# Patient Record
Sex: Female | Born: 1985
Health system: Southern US, Community
[De-identification: ages and names within clinical notes are randomized; demographics above are authoritative.]

## PROBLEM LIST (undated history)

## (undated) DIAGNOSIS — B019 Varicella without complication: Secondary | ICD-10-CM

## (undated) DIAGNOSIS — J939 Pneumothorax, unspecified: Secondary | ICD-10-CM

## (undated) DIAGNOSIS — C8330 Diffuse large B-cell lymphoma, unspecified site: Secondary | ICD-10-CM

## (undated) DIAGNOSIS — J45909 Unspecified asthma, uncomplicated: Secondary | ICD-10-CM

## (undated) DIAGNOSIS — D6481 Anemia due to antineoplastic chemotherapy: Secondary | ICD-10-CM

## (undated) DIAGNOSIS — T451X5A Adverse effect of antineoplastic and immunosuppressive drugs, initial encounter: Secondary | ICD-10-CM

## (undated) DIAGNOSIS — Z9289 Personal history of other medical treatment: Secondary | ICD-10-CM

## (undated) HISTORY — DX: Anemia due to antineoplastic chemotherapy: D64.81

## (undated) HISTORY — DX: Varicella without complication: B01.9

## (undated) HISTORY — DX: Diffuse large B-cell lymphoma, unspecified site: C83.30

## (undated) HISTORY — DX: Pneumothorax, unspecified: J93.9

## (undated) HISTORY — DX: Adverse effect of antineoplastic and immunosuppressive drugs, initial encounter: T45.1X5A

## (undated) HISTORY — DX: Personal history of other medical treatment: Z92.89

---

## 2008-07-02 ENCOUNTER — Emergency Department (HOSPITAL_BASED_OUTPATIENT_CLINIC_OR_DEPARTMENT_OTHER): Admission: EM | Admit: 2008-07-02 | Discharge: 2008-07-02 | Payer: Self-pay | Admitting: Emergency Medicine

## 2008-07-12 ENCOUNTER — Other Ambulatory Visit: Admission: RE | Admit: 2008-07-12 | Discharge: 2008-07-12 | Payer: Self-pay | Admitting: Family Medicine

## 2013-01-04 ENCOUNTER — Other Ambulatory Visit: Payer: Self-pay | Admitting: Family Medicine

## 2013-01-04 ENCOUNTER — Other Ambulatory Visit (HOSPITAL_COMMUNITY)
Admission: RE | Admit: 2013-01-04 | Discharge: 2013-01-04 | Disposition: A | Discharge status: Home / Self Care | Payer: BC Managed Care – PPO | Source: Ambulatory Visit | Attending: Family Medicine | Admitting: Family Medicine

## 2013-01-04 DIAGNOSIS — Z124 Encounter for screening for malignant neoplasm of cervix: Secondary | ICD-10-CM | POA: Insufficient documentation

## 2013-06-22 ENCOUNTER — Other Ambulatory Visit: Payer: Self-pay | Admitting: Family Medicine

## 2013-06-22 DIAGNOSIS — E041 Nontoxic single thyroid nodule: Secondary | ICD-10-CM

## 2013-07-05 ENCOUNTER — Ambulatory Visit
Admission: RE | Admit: 2013-07-05 | Discharge: 2013-07-05 | Disposition: A | Discharge status: Home / Self Care | Payer: BC Managed Care – PPO | Source: Ambulatory Visit | Attending: Family Medicine | Admitting: Family Medicine

## 2013-07-05 DIAGNOSIS — E041 Nontoxic single thyroid nodule: Secondary | ICD-10-CM

## 2013-07-06 ENCOUNTER — Other Ambulatory Visit: Payer: Self-pay | Admitting: Family Medicine

## 2013-07-06 DIAGNOSIS — E041 Nontoxic single thyroid nodule: Secondary | ICD-10-CM

## 2013-07-22 ENCOUNTER — Other Ambulatory Visit (HOSPITAL_COMMUNITY)
Admission: RE | Admit: 2013-07-22 | Discharge: 2013-07-22 | Disposition: A | Discharge status: Home / Self Care | Payer: BC Managed Care – PPO | Source: Ambulatory Visit | Attending: Interventional Radiology | Admitting: Interventional Radiology

## 2013-07-22 ENCOUNTER — Ambulatory Visit
Admission: RE | Admit: 2013-07-22 | Discharge: 2013-07-22 | Disposition: A | Discharge status: Home / Self Care | Payer: BC Managed Care – PPO | Source: Ambulatory Visit | Attending: Family Medicine | Admitting: Family Medicine

## 2013-07-22 DIAGNOSIS — E041 Nontoxic single thyroid nodule: Secondary | ICD-10-CM | POA: Insufficient documentation

## 2013-12-08 ENCOUNTER — Other Ambulatory Visit: Payer: Self-pay | Admitting: Family Medicine

## 2013-12-08 DIAGNOSIS — E041 Nontoxic single thyroid nodule: Secondary | ICD-10-CM

## 2013-12-24 ENCOUNTER — Ambulatory Visit
Admission: RE | Admit: 2013-12-24 | Discharge: 2013-12-24 | Disposition: A | Discharge status: Home / Self Care | Payer: BC Managed Care – PPO | Source: Ambulatory Visit | Attending: Family Medicine | Admitting: Family Medicine

## 2013-12-24 DIAGNOSIS — E041 Nontoxic single thyroid nodule: Secondary | ICD-10-CM

## 2014-12-26 ENCOUNTER — Other Ambulatory Visit: Payer: Self-pay | Admitting: Family Medicine

## 2014-12-26 DIAGNOSIS — E041 Nontoxic single thyroid nodule: Secondary | ICD-10-CM

## 2014-12-29 ENCOUNTER — Ambulatory Visit
Admission: RE | Admit: 2014-12-29 | Discharge: 2014-12-29 | Disposition: A | Discharge status: Home / Self Care | Payer: BLUE CROSS/BLUE SHIELD | Source: Ambulatory Visit | Attending: Family Medicine | Admitting: Family Medicine

## 2014-12-29 DIAGNOSIS — E041 Nontoxic single thyroid nodule: Secondary | ICD-10-CM

## 2016-01-23 ENCOUNTER — Other Ambulatory Visit: Payer: Self-pay | Admitting: Family Medicine

## 2016-01-23 DIAGNOSIS — E042 Nontoxic multinodular goiter: Secondary | ICD-10-CM

## 2016-02-20 DIAGNOSIS — E041 Nontoxic single thyroid nodule: Secondary | ICD-10-CM | POA: Diagnosis not present

## 2016-03-20 DIAGNOSIS — E041 Nontoxic single thyroid nodule: Secondary | ICD-10-CM | POA: Diagnosis not present

## 2016-03-20 DIAGNOSIS — Z0000 Encounter for general adult medical examination without abnormal findings: Secondary | ICD-10-CM | POA: Diagnosis not present

## 2016-04-05 DIAGNOSIS — E042 Nontoxic multinodular goiter: Secondary | ICD-10-CM | POA: Diagnosis not present

## 2016-04-05 DIAGNOSIS — Z8639 Personal history of other endocrine, nutritional and metabolic disease: Secondary | ICD-10-CM | POA: Diagnosis not present

## 2016-04-16 DIAGNOSIS — F4323 Adjustment disorder with mixed anxiety and depressed mood: Secondary | ICD-10-CM | POA: Diagnosis not present

## 2016-04-25 DIAGNOSIS — F4323 Adjustment disorder with mixed anxiety and depressed mood: Secondary | ICD-10-CM | POA: Diagnosis not present

## 2016-05-08 DIAGNOSIS — F4323 Adjustment disorder with mixed anxiety and depressed mood: Secondary | ICD-10-CM | POA: Diagnosis not present

## 2016-05-30 DIAGNOSIS — F4323 Adjustment disorder with mixed anxiety and depressed mood: Secondary | ICD-10-CM | POA: Diagnosis not present

## 2016-06-26 DIAGNOSIS — F4323 Adjustment disorder with mixed anxiety and depressed mood: Secondary | ICD-10-CM | POA: Diagnosis not present

## 2016-07-24 DIAGNOSIS — F4323 Adjustment disorder with mixed anxiety and depressed mood: Secondary | ICD-10-CM | POA: Diagnosis not present

## 2016-07-24 DIAGNOSIS — L309 Dermatitis, unspecified: Secondary | ICD-10-CM | POA: Diagnosis not present

## 2016-08-09 DIAGNOSIS — J4541 Moderate persistent asthma with (acute) exacerbation: Secondary | ICD-10-CM | POA: Diagnosis not present

## 2016-09-04 DIAGNOSIS — F4323 Adjustment disorder with mixed anxiety and depressed mood: Secondary | ICD-10-CM | POA: Diagnosis not present

## 2016-09-09 DIAGNOSIS — J4541 Moderate persistent asthma with (acute) exacerbation: Secondary | ICD-10-CM | POA: Diagnosis not present

## 2016-09-11 DIAGNOSIS — J90 Pleural effusion, not elsewhere classified: Secondary | ICD-10-CM | POA: Diagnosis not present

## 2016-09-11 DIAGNOSIS — R05 Cough: Secondary | ICD-10-CM | POA: Diagnosis not present

## 2016-09-12 DIAGNOSIS — R05 Cough: Secondary | ICD-10-CM | POA: Diagnosis not present

## 2016-09-13 DIAGNOSIS — R222 Localized swelling, mass and lump, trunk: Secondary | ICD-10-CM | POA: Diagnosis not present

## 2016-09-13 DIAGNOSIS — R938 Abnormal findings on diagnostic imaging of other specified body structures: Secondary | ICD-10-CM | POA: Diagnosis not present

## 2016-09-13 DIAGNOSIS — J9859 Other diseases of mediastinum, not elsewhere classified: Secondary | ICD-10-CM | POA: Diagnosis not present

## 2016-09-13 DIAGNOSIS — J45909 Unspecified asthma, uncomplicated: Secondary | ICD-10-CM | POA: Diagnosis not present

## 2016-09-13 DIAGNOSIS — R599 Enlarged lymph nodes, unspecified: Secondary | ICD-10-CM | POA: Diagnosis not present

## 2016-09-13 DIAGNOSIS — J90 Pleural effusion, not elsewhere classified: Secondary | ICD-10-CM | POA: Diagnosis not present

## 2016-09-13 DIAGNOSIS — R590 Localized enlarged lymph nodes: Secondary | ICD-10-CM | POA: Diagnosis not present

## 2016-09-13 DIAGNOSIS — R161 Splenomegaly, not elsewhere classified: Secondary | ICD-10-CM | POA: Diagnosis not present

## 2016-09-13 DIAGNOSIS — R0602 Shortness of breath: Secondary | ICD-10-CM | POA: Diagnosis not present

## 2016-09-13 DIAGNOSIS — R591 Generalized enlarged lymph nodes: Secondary | ICD-10-CM | POA: Diagnosis not present

## 2016-09-13 DIAGNOSIS — R918 Other nonspecific abnormal finding of lung field: Secondary | ICD-10-CM | POA: Diagnosis not present

## 2016-09-13 DIAGNOSIS — C8510 Unspecified B-cell lymphoma, unspecified site: Secondary | ICD-10-CM | POA: Diagnosis not present

## 2016-09-13 DIAGNOSIS — J9811 Atelectasis: Secondary | ICD-10-CM | POA: Diagnosis not present

## 2016-09-13 DIAGNOSIS — E041 Nontoxic single thyroid nodule: Secondary | ICD-10-CM | POA: Diagnosis not present

## 2016-09-14 DIAGNOSIS — R918 Other nonspecific abnormal finding of lung field: Secondary | ICD-10-CM | POA: Diagnosis not present

## 2016-09-14 DIAGNOSIS — R222 Localized swelling, mass and lump, trunk: Secondary | ICD-10-CM | POA: Diagnosis not present

## 2016-09-14 DIAGNOSIS — J9859 Other diseases of mediastinum, not elsewhere classified: Secondary | ICD-10-CM | POA: Diagnosis not present

## 2016-09-14 DIAGNOSIS — J90 Pleural effusion, not elsewhere classified: Secondary | ICD-10-CM | POA: Diagnosis not present

## 2016-09-14 DIAGNOSIS — R0602 Shortness of breath: Secondary | ICD-10-CM | POA: Diagnosis not present

## 2016-09-14 DIAGNOSIS — C8510 Unspecified B-cell lymphoma, unspecified site: Secondary | ICD-10-CM | POA: Diagnosis not present

## 2016-09-14 DIAGNOSIS — R591 Generalized enlarged lymph nodes: Secondary | ICD-10-CM | POA: Diagnosis not present

## 2016-09-19 DIAGNOSIS — C8339 Diffuse large B-cell lymphoma, extranodal and solid organ sites: Secondary | ICD-10-CM | POA: Diagnosis not present

## 2016-09-19 DIAGNOSIS — C8332 Diffuse large B-cell lymphoma, intrathoracic lymph nodes: Secondary | ICD-10-CM | POA: Diagnosis not present

## 2016-09-19 DIAGNOSIS — R918 Other nonspecific abnormal finding of lung field: Secondary | ICD-10-CM | POA: Diagnosis not present

## 2016-09-20 DIAGNOSIS — J9859 Other diseases of mediastinum, not elsewhere classified: Secondary | ICD-10-CM | POA: Diagnosis not present

## 2016-09-20 DIAGNOSIS — R05 Cough: Secondary | ICD-10-CM | POA: Diagnosis not present

## 2016-09-20 DIAGNOSIS — E041 Nontoxic single thyroid nodule: Secondary | ICD-10-CM | POA: Diagnosis not present

## 2016-09-24 DIAGNOSIS — N946 Dysmenorrhea, unspecified: Secondary | ICD-10-CM | POA: Diagnosis not present

## 2016-09-24 DIAGNOSIS — J910 Malignant pleural effusion: Secondary | ICD-10-CM | POA: Diagnosis not present

## 2016-09-24 DIAGNOSIS — Z452 Encounter for adjustment and management of vascular access device: Secondary | ICD-10-CM | POA: Diagnosis not present

## 2016-09-24 DIAGNOSIS — R918 Other nonspecific abnormal finding of lung field: Secondary | ICD-10-CM | POA: Diagnosis not present

## 2016-09-24 DIAGNOSIS — R0609 Other forms of dyspnea: Secondary | ICD-10-CM | POA: Diagnosis not present

## 2016-09-24 DIAGNOSIS — J90 Pleural effusion, not elsewhere classified: Secondary | ICD-10-CM | POA: Diagnosis not present

## 2016-09-24 DIAGNOSIS — R222 Localized swelling, mass and lump, trunk: Secondary | ICD-10-CM | POA: Diagnosis not present

## 2016-09-24 DIAGNOSIS — E874 Mixed disorder of acid-base balance: Secondary | ICD-10-CM | POA: Diagnosis not present

## 2016-09-24 DIAGNOSIS — E041 Nontoxic single thyroid nodule: Secondary | ICD-10-CM | POA: Diagnosis not present

## 2016-09-24 DIAGNOSIS — K589 Irritable bowel syndrome without diarrhea: Secondary | ICD-10-CM | POA: Diagnosis not present

## 2016-09-24 DIAGNOSIS — J45909 Unspecified asthma, uncomplicated: Secondary | ICD-10-CM | POA: Diagnosis not present

## 2016-09-24 DIAGNOSIS — C8332 Diffuse large B-cell lymphoma, intrathoracic lymph nodes: Secondary | ICD-10-CM | POA: Diagnosis not present

## 2016-09-24 DIAGNOSIS — J9859 Other diseases of mediastinum, not elsewhere classified: Secondary | ICD-10-CM | POA: Diagnosis not present

## 2016-09-24 DIAGNOSIS — C8330 Diffuse large B-cell lymphoma, unspecified site: Secondary | ICD-10-CM | POA: Diagnosis not present

## 2016-09-24 DIAGNOSIS — C801 Malignant (primary) neoplasm, unspecified: Secondary | ICD-10-CM | POA: Diagnosis not present

## 2016-09-24 DIAGNOSIS — R079 Chest pain, unspecified: Secondary | ICD-10-CM | POA: Diagnosis not present

## 2016-09-24 DIAGNOSIS — R05 Cough: Secondary | ICD-10-CM | POA: Diagnosis not present

## 2016-09-24 DIAGNOSIS — R55 Syncope and collapse: Secondary | ICD-10-CM | POA: Diagnosis not present

## 2016-09-24 DIAGNOSIS — I959 Hypotension, unspecified: Secondary | ICD-10-CM | POA: Diagnosis not present

## 2016-09-25 DIAGNOSIS — C8332 Diffuse large B-cell lymphoma, intrathoracic lymph nodes: Secondary | ICD-10-CM | POA: Diagnosis not present

## 2016-09-25 DIAGNOSIS — J90 Pleural effusion, not elsewhere classified: Secondary | ICD-10-CM | POA: Diagnosis not present

## 2016-09-25 DIAGNOSIS — J9859 Other diseases of mediastinum, not elsewhere classified: Secondary | ICD-10-CM | POA: Diagnosis not present

## 2016-09-25 DIAGNOSIS — C8330 Diffuse large B-cell lymphoma, unspecified site: Secondary | ICD-10-CM | POA: Diagnosis not present

## 2016-09-25 DIAGNOSIS — R079 Chest pain, unspecified: Secondary | ICD-10-CM | POA: Diagnosis not present

## 2016-09-26 DIAGNOSIS — C8332 Diffuse large B-cell lymphoma, intrathoracic lymph nodes: Secondary | ICD-10-CM | POA: Diagnosis not present

## 2016-09-27 DIAGNOSIS — R079 Chest pain, unspecified: Secondary | ICD-10-CM | POA: Diagnosis not present

## 2016-09-28 DIAGNOSIS — R079 Chest pain, unspecified: Secondary | ICD-10-CM | POA: Diagnosis not present

## 2016-09-29 DIAGNOSIS — R079 Chest pain, unspecified: Secondary | ICD-10-CM | POA: Diagnosis not present

## 2016-09-30 DIAGNOSIS — R0609 Other forms of dyspnea: Secondary | ICD-10-CM | POA: Diagnosis not present

## 2016-09-30 DIAGNOSIS — J910 Malignant pleural effusion: Secondary | ICD-10-CM | POA: Diagnosis not present

## 2016-09-30 DIAGNOSIS — R079 Chest pain, unspecified: Secondary | ICD-10-CM | POA: Diagnosis not present

## 2016-09-30 DIAGNOSIS — C8332 Diffuse large B-cell lymphoma, intrathoracic lymph nodes: Secondary | ICD-10-CM | POA: Diagnosis not present

## 2016-10-01 DIAGNOSIS — Z452 Encounter for adjustment and management of vascular access device: Secondary | ICD-10-CM | POA: Diagnosis not present

## 2016-10-01 DIAGNOSIS — C8332 Diffuse large B-cell lymphoma, intrathoracic lymph nodes: Secondary | ICD-10-CM | POA: Diagnosis not present

## 2016-10-01 DIAGNOSIS — R079 Chest pain, unspecified: Secondary | ICD-10-CM | POA: Diagnosis not present

## 2016-10-01 DIAGNOSIS — C801 Malignant (primary) neoplasm, unspecified: Secondary | ICD-10-CM | POA: Diagnosis not present

## 2016-10-01 DIAGNOSIS — R0609 Other forms of dyspnea: Secondary | ICD-10-CM | POA: Diagnosis not present

## 2016-10-01 DIAGNOSIS — J910 Malignant pleural effusion: Secondary | ICD-10-CM | POA: Diagnosis not present

## 2016-10-02 DIAGNOSIS — T819XXA Unspecified complication of procedure, initial encounter: Secondary | ICD-10-CM | POA: Diagnosis not present

## 2016-10-02 DIAGNOSIS — R0602 Shortness of breath: Secondary | ICD-10-CM | POA: Diagnosis not present

## 2016-10-02 DIAGNOSIS — R52 Pain, unspecified: Secondary | ICD-10-CM | POA: Diagnosis not present

## 2016-10-02 DIAGNOSIS — T85848A Pain due to other internal prosthetic devices, implants and grafts, initial encounter: Secondary | ICD-10-CM | POA: Diagnosis not present

## 2016-10-02 DIAGNOSIS — Z9889 Other specified postprocedural states: Secondary | ICD-10-CM | POA: Diagnosis not present

## 2016-10-02 DIAGNOSIS — J90 Pleural effusion, not elsewhere classified: Secondary | ICD-10-CM | POA: Diagnosis not present

## 2016-10-02 DIAGNOSIS — R938 Abnormal findings on diagnostic imaging of other specified body structures: Secondary | ICD-10-CM | POA: Diagnosis not present

## 2016-10-02 DIAGNOSIS — R222 Localized swelling, mass and lump, trunk: Secondary | ICD-10-CM | POA: Diagnosis not present

## 2016-10-02 DIAGNOSIS — J9811 Atelectasis: Secondary | ICD-10-CM | POA: Diagnosis not present

## 2016-10-02 DIAGNOSIS — G620 Drug-induced polyneuropathy: Secondary | ICD-10-CM | POA: Diagnosis not present

## 2016-10-02 DIAGNOSIS — C8330 Diffuse large B-cell lymphoma, unspecified site: Secondary | ICD-10-CM | POA: Diagnosis not present

## 2016-10-02 DIAGNOSIS — R05 Cough: Secondary | ICD-10-CM | POA: Diagnosis not present

## 2016-10-02 DIAGNOSIS — T451X5A Adverse effect of antineoplastic and immunosuppressive drugs, initial encounter: Secondary | ICD-10-CM | POA: Diagnosis not present

## 2016-10-02 DIAGNOSIS — K219 Gastro-esophageal reflux disease without esophagitis: Secondary | ICD-10-CM | POA: Diagnosis not present

## 2016-10-02 DIAGNOSIS — C8510 Unspecified B-cell lymphoma, unspecified site: Secondary | ICD-10-CM | POA: Diagnosis not present

## 2016-10-02 DIAGNOSIS — J939 Pneumothorax, unspecified: Secondary | ICD-10-CM | POA: Diagnosis not present

## 2016-10-02 DIAGNOSIS — R0789 Other chest pain: Secondary | ICD-10-CM | POA: Diagnosis not present

## 2016-10-02 DIAGNOSIS — J45909 Unspecified asthma, uncomplicated: Secondary | ICD-10-CM | POA: Diagnosis not present

## 2016-10-02 DIAGNOSIS — C8332 Diffuse large B-cell lymphoma, intrathoracic lymph nodes: Secondary | ICD-10-CM | POA: Diagnosis not present

## 2016-10-02 DIAGNOSIS — R918 Other nonspecific abnormal finding of lung field: Secondary | ICD-10-CM | POA: Diagnosis not present

## 2016-10-02 DIAGNOSIS — K589 Irritable bowel syndrome without diarrhea: Secondary | ICD-10-CM | POA: Diagnosis not present

## 2016-10-02 DIAGNOSIS — J910 Malignant pleural effusion: Secondary | ICD-10-CM | POA: Diagnosis not present

## 2016-10-02 DIAGNOSIS — I313 Pericardial effusion (noninflammatory): Secondary | ICD-10-CM | POA: Diagnosis not present

## 2016-10-04 DIAGNOSIS — R938 Abnormal findings on diagnostic imaging of other specified body structures: Secondary | ICD-10-CM | POA: Diagnosis not present

## 2016-10-04 DIAGNOSIS — C8510 Unspecified B-cell lymphoma, unspecified site: Secondary | ICD-10-CM | POA: Diagnosis not present

## 2016-10-04 DIAGNOSIS — J9811 Atelectasis: Secondary | ICD-10-CM | POA: Diagnosis not present

## 2016-10-04 DIAGNOSIS — T819XXA Unspecified complication of procedure, initial encounter: Secondary | ICD-10-CM | POA: Diagnosis not present

## 2016-10-04 DIAGNOSIS — Z9889 Other specified postprocedural states: Secondary | ICD-10-CM | POA: Diagnosis not present

## 2016-10-04 DIAGNOSIS — R52 Pain, unspecified: Secondary | ICD-10-CM | POA: Diagnosis not present

## 2016-10-04 DIAGNOSIS — J90 Pleural effusion, not elsewhere classified: Secondary | ICD-10-CM | POA: Diagnosis not present

## 2016-10-04 DIAGNOSIS — R05 Cough: Secondary | ICD-10-CM | POA: Diagnosis not present

## 2016-10-04 DIAGNOSIS — R0602 Shortness of breath: Secondary | ICD-10-CM | POA: Diagnosis not present

## 2016-10-04 DIAGNOSIS — I313 Pericardial effusion (noninflammatory): Secondary | ICD-10-CM | POA: Diagnosis not present

## 2016-10-04 DIAGNOSIS — C8332 Diffuse large B-cell lymphoma, intrathoracic lymph nodes: Secondary | ICD-10-CM | POA: Diagnosis not present

## 2016-10-04 DIAGNOSIS — R0789 Other chest pain: Secondary | ICD-10-CM | POA: Diagnosis not present

## 2016-10-04 DIAGNOSIS — R918 Other nonspecific abnormal finding of lung field: Secondary | ICD-10-CM | POA: Diagnosis not present

## 2016-10-05 DIAGNOSIS — R918 Other nonspecific abnormal finding of lung field: Secondary | ICD-10-CM | POA: Diagnosis not present

## 2016-10-05 DIAGNOSIS — J939 Pneumothorax, unspecified: Secondary | ICD-10-CM | POA: Diagnosis not present

## 2016-10-05 DIAGNOSIS — C8332 Diffuse large B-cell lymphoma, intrathoracic lymph nodes: Secondary | ICD-10-CM | POA: Diagnosis not present

## 2016-10-05 DIAGNOSIS — J910 Malignant pleural effusion: Secondary | ICD-10-CM | POA: Diagnosis not present

## 2016-10-05 DIAGNOSIS — R222 Localized swelling, mass and lump, trunk: Secondary | ICD-10-CM | POA: Diagnosis not present

## 2016-10-05 HISTORY — DX: Pneumothorax, unspecified: J93.9

## 2016-10-06 DIAGNOSIS — R05 Cough: Secondary | ICD-10-CM | POA: Diagnosis not present

## 2016-10-06 DIAGNOSIS — C8332 Diffuse large B-cell lymphoma, intrathoracic lymph nodes: Secondary | ICD-10-CM | POA: Diagnosis not present

## 2016-10-06 DIAGNOSIS — R0789 Other chest pain: Secondary | ICD-10-CM | POA: Diagnosis not present

## 2016-10-06 DIAGNOSIS — R52 Pain, unspecified: Secondary | ICD-10-CM | POA: Diagnosis not present

## 2016-10-06 DIAGNOSIS — J910 Malignant pleural effusion: Secondary | ICD-10-CM | POA: Diagnosis not present

## 2016-10-07 DIAGNOSIS — J90 Pleural effusion, not elsewhere classified: Secondary | ICD-10-CM | POA: Diagnosis not present

## 2016-10-07 DIAGNOSIS — R918 Other nonspecific abnormal finding of lung field: Secondary | ICD-10-CM | POA: Diagnosis not present

## 2016-10-07 DIAGNOSIS — J939 Pneumothorax, unspecified: Secondary | ICD-10-CM | POA: Diagnosis not present

## 2016-10-07 DIAGNOSIS — J910 Malignant pleural effusion: Secondary | ICD-10-CM | POA: Diagnosis not present

## 2016-10-10 DIAGNOSIS — C8332 Diffuse large B-cell lymphoma, intrathoracic lymph nodes: Secondary | ICD-10-CM | POA: Diagnosis not present

## 2016-10-14 DIAGNOSIS — K589 Irritable bowel syndrome without diarrhea: Secondary | ICD-10-CM | POA: Diagnosis not present

## 2016-10-14 DIAGNOSIS — L659 Nonscarring hair loss, unspecified: Secondary | ICD-10-CM | POA: Diagnosis not present

## 2016-10-14 DIAGNOSIS — R110 Nausea: Secondary | ICD-10-CM | POA: Diagnosis not present

## 2016-10-14 DIAGNOSIS — Z5111 Encounter for antineoplastic chemotherapy: Secondary | ICD-10-CM | POA: Diagnosis not present

## 2016-10-14 DIAGNOSIS — Z79899 Other long term (current) drug therapy: Secondary | ICD-10-CM | POA: Diagnosis not present

## 2016-10-14 DIAGNOSIS — C8330 Diffuse large B-cell lymphoma, unspecified site: Secondary | ICD-10-CM | POA: Diagnosis not present

## 2016-10-14 DIAGNOSIS — C8332 Diffuse large B-cell lymphoma, intrathoracic lymph nodes: Secondary | ICD-10-CM | POA: Diagnosis not present

## 2016-10-17 DIAGNOSIS — C8332 Diffuse large B-cell lymphoma, intrathoracic lymph nodes: Secondary | ICD-10-CM | POA: Diagnosis not present

## 2016-10-17 DIAGNOSIS — L659 Nonscarring hair loss, unspecified: Secondary | ICD-10-CM | POA: Diagnosis not present

## 2016-10-17 DIAGNOSIS — Z5111 Encounter for antineoplastic chemotherapy: Secondary | ICD-10-CM | POA: Diagnosis not present

## 2016-10-18 DIAGNOSIS — C8332 Diffuse large B-cell lymphoma, intrathoracic lymph nodes: Secondary | ICD-10-CM | POA: Diagnosis not present

## 2016-10-18 DIAGNOSIS — Z79899 Other long term (current) drug therapy: Secondary | ICD-10-CM | POA: Diagnosis not present

## 2016-10-19 DIAGNOSIS — C8332 Diffuse large B-cell lymphoma, intrathoracic lymph nodes: Secondary | ICD-10-CM | POA: Diagnosis not present

## 2016-10-19 DIAGNOSIS — Z79899 Other long term (current) drug therapy: Secondary | ICD-10-CM | POA: Diagnosis not present

## 2016-10-20 DIAGNOSIS — C8332 Diffuse large B-cell lymphoma, intrathoracic lymph nodes: Secondary | ICD-10-CM | POA: Diagnosis not present

## 2016-10-20 DIAGNOSIS — Z79899 Other long term (current) drug therapy: Secondary | ICD-10-CM | POA: Diagnosis not present

## 2016-10-22 DIAGNOSIS — C8332 Diffuse large B-cell lymphoma, intrathoracic lymph nodes: Secondary | ICD-10-CM | POA: Diagnosis not present

## 2016-10-22 DIAGNOSIS — Z79899 Other long term (current) drug therapy: Secondary | ICD-10-CM | POA: Diagnosis not present

## 2016-10-23 DIAGNOSIS — C8332 Diffuse large B-cell lymphoma, intrathoracic lymph nodes: Secondary | ICD-10-CM | POA: Diagnosis not present

## 2016-10-25 DIAGNOSIS — C8332 Diffuse large B-cell lymphoma, intrathoracic lymph nodes: Secondary | ICD-10-CM | POA: Diagnosis not present

## 2016-10-28 ENCOUNTER — Encounter (HOSPITAL_COMMUNITY): Payer: Self-pay | Admitting: Emergency Medicine

## 2016-10-28 ENCOUNTER — Emergency Department (HOSPITAL_COMMUNITY): Payer: BLUE CROSS/BLUE SHIELD

## 2016-10-28 ENCOUNTER — Inpatient Hospital Stay (HOSPITAL_COMMUNITY)
Admission: EM | Admit: 2016-10-28 | Discharge: 2016-10-30 | DRG: 809 | Disposition: A | Discharge status: Home / Self Care | Payer: BLUE CROSS/BLUE SHIELD | Attending: Internal Medicine | Admitting: Internal Medicine

## 2016-10-28 DIAGNOSIS — Z79899 Other long term (current) drug therapy: Secondary | ICD-10-CM | POA: Diagnosis not present

## 2016-10-28 DIAGNOSIS — C8330 Diffuse large B-cell lymphoma, unspecified site: Secondary | ICD-10-CM | POA: Diagnosis not present

## 2016-10-28 DIAGNOSIS — D649 Anemia, unspecified: Secondary | ICD-10-CM | POA: Diagnosis not present

## 2016-10-28 DIAGNOSIS — K5900 Constipation, unspecified: Secondary | ICD-10-CM | POA: Diagnosis not present

## 2016-10-28 DIAGNOSIS — J45909 Unspecified asthma, uncomplicated: Secondary | ICD-10-CM | POA: Diagnosis not present

## 2016-10-28 DIAGNOSIS — R5081 Fever presenting with conditions classified elsewhere: Secondary | ICD-10-CM | POA: Diagnosis not present

## 2016-10-28 DIAGNOSIS — C8332 Diffuse large B-cell lymphoma, intrathoracic lymph nodes: Secondary | ICD-10-CM | POA: Diagnosis not present

## 2016-10-28 DIAGNOSIS — Z79891 Long term (current) use of opiate analgesic: Secondary | ICD-10-CM | POA: Diagnosis not present

## 2016-10-28 DIAGNOSIS — R000 Tachycardia, unspecified: Secondary | ICD-10-CM | POA: Diagnosis not present

## 2016-10-28 DIAGNOSIS — D696 Thrombocytopenia, unspecified: Secondary | ICD-10-CM | POA: Diagnosis present

## 2016-10-28 DIAGNOSIS — T451X5A Adverse effect of antineoplastic and immunosuppressive drugs, initial encounter: Secondary | ICD-10-CM | POA: Diagnosis not present

## 2016-10-28 DIAGNOSIS — D709 Neutropenia, unspecified: Secondary | ICD-10-CM

## 2016-10-28 DIAGNOSIS — D6481 Anemia due to antineoplastic chemotherapy: Secondary | ICD-10-CM | POA: Diagnosis present

## 2016-10-28 DIAGNOSIS — R509 Fever, unspecified: Secondary | ICD-10-CM | POA: Diagnosis not present

## 2016-10-28 HISTORY — DX: Unspecified asthma, uncomplicated: J45.909

## 2016-10-28 LAB — URINALYSIS, ROUTINE W REFLEX MICROSCOPIC
Bilirubin Urine: NEGATIVE
Glucose, UA: NEGATIVE mg/dL
HGB URINE DIPSTICK: NEGATIVE
Ketones, ur: NEGATIVE mg/dL
Leukocytes, UA: NEGATIVE
Nitrite: NEGATIVE
Protein, ur: NEGATIVE mg/dL
SPECIFIC GRAVITY, URINE: 1.01 (ref 1.005–1.030)
pH: 8 (ref 5.0–8.0)

## 2016-10-28 LAB — I-STAT CG4 LACTIC ACID, ED: Lactic Acid, Venous: 2.03 mmol/L (ref 0.5–1.9)

## 2016-10-28 LAB — PREGNANCY, URINE: Preg Test, Ur: NEGATIVE

## 2016-10-28 MED ORDER — SODIUM CHLORIDE 0.9 % IV BOLUS (SEPSIS)
1000.0000 mL | Freq: Once | INTRAVENOUS | Status: AC
  Administered 2016-10-28: 1000 mL via INTRAVENOUS

## 2016-10-28 MED ORDER — CEFEPIME HCL 2 G IJ SOLR
2.0000 g | Freq: Once | INTRAMUSCULAR | Status: AC
  Administered 2016-10-28: 2 g via INTRAVENOUS
  Filled 2016-10-28: qty 2

## 2016-10-28 MED ORDER — KETOROLAC TROMETHAMINE 30 MG/ML IJ SOLN
15.0000 mg | Freq: Once | INTRAMUSCULAR | Status: AC
  Administered 2016-10-28: 15 mg via INTRAVENOUS
  Filled 2016-10-28: qty 1

## 2016-10-28 MED ORDER — VANCOMYCIN HCL 10 G IV SOLR
1500.0000 mg | Freq: Once | INTRAVENOUS | Status: AC
  Administered 2016-10-28: 1500 mg via INTRAVENOUS
  Filled 2016-10-28: qty 1500

## 2016-10-28 NOTE — Progress Notes (Signed)
Pharmacy Antibiotic Note  Barbette Mcglaun is a 31 y.o. female admitted on 10/28/2016 with febrile neutropenia.  Pharmacy has been consulted for vancomycin dosing.  Plan: Vancomycin 1500mg *1 Follow up renal function to schedule future doses  Height: 5\' 2"  (157.5 cm) Weight: 174 lb (78.9 kg) IBW/kg (Calculated) : 50.1  Temp (24hrs), Avg:99.1 F (37.3 C), Min:99.1 F (37.3 C), Max:99.1 F (37.3 C)   Recent Labs Lab 10/28/16 2203  LATICACIDVEN 2.03*    CrCl cannot be calculated (No order found.).    No Known Allergies  Antimicrobials this admission: 4/16 Vanc >>  4/16 Cefepime >>   Dose adjustments this admission:  Microbiology results:  Thank you for allowing pharmacy to be a part of this patient's care.  Melburn Popper 10/28/2016 10:15 PM

## 2016-10-28 NOTE — ED Triage Notes (Signed)
Pt is on chemotherapy and neutropenic precautions started having some fever 101 at home took hydrocodone at 1900 for generalized body ache on arrival she had a temp 99.1 oral. Pt had lab draw this morning on [facility] and have her results with her. Denies any pain at this time.

## 2016-10-28 NOTE — ED Notes (Signed)
Dr. Little at the bedside. 

## 2016-10-28 NOTE — ED Provider Notes (Signed)
[place] DEPT Provider Note   CSN: [id number] Arrival date & time: 10/28/16  2118     History   Chief Complaint Chief Complaint  Patient presents with  . Fever    HPI Michelle Proctor is a 31 y.o. female.  The history is provided by the patient.  Fever   This is a new problem. The current episode started 3 to 5 hours ago. The problem occurs constantly. The problem has been gradually worsening. The maximum temperature noted was 101 to 101.9 F. The temperature was taken using an oral thermometer. Associated symptoms include muscle aches. Pertinent negatives include no chest pain, no fussiness, no sleepiness, no diarrhea, no vomiting, no congestion, no headaches, no sore throat, no tugging at ear and no cough. She has tried nothing for the symptoms. The treatment provided no relief.    Past Medical History:  Diagnosis Date  . Asthma   . Cancer ([place])    limphoma    There are no active problems to display for this patient.   History reviewed. No pertinent surgical history.  OB History    No data available       Home Medications    Prior to Admission medications   Medication Sig Start Date End Date Taking? Authorizing Provider  acyclovir (ZOVIRAX) 200 MG capsule Take 400 mg by mouth every 12 (twelve) hours.   Yes Historical Provider, MD  albuterol (VENTOLIN HFA) 108 (90 Base) MCG/ACT inhaler Inhale 2 puffs into the lungs every 6 (six) hours as needed for wheezing or shortness of breath.   Yes Historical Provider, MD  docusate sodium (COLACE) 100 MG capsule Take 100 mg by mouth at bedtime as needed for mild constipation.   Yes Historical Provider, MD  fluconazole (DIFLUCAN) 200 MG tablet Take 400 mg by mouth at bedtime.   Yes Historical Provider, MD  Leuprolide Acetate, 3 Month, (LUPRON DEPOT, 30-MONTH, IM) Inject into the muscle every 3 (three) months.   Yes Historical Provider, MD  loratadine (CLARITIN) 10 MG tablet Take 10 mg by mouth daily.   Yes Historical Provider, MD   ondansetron (ZOFRAN) 4 MG tablet Take 4-8 mg by mouth every 8 (eight) hours as needed for nausea or vomiting.   Yes Historical Provider, MD  oxyCODONE (OXY IR/ROXICODONE) 5 MG immediate release tablet Take 5 mg by mouth every 6 (six) hours as needed for pain. 10/07/16  Yes Historical Provider, MD  pantoprazole (PROTONIX) 40 MG tablet Take 40 mg by mouth daily.   Yes Historical Provider, MD  Pegfilgrastim (NEULASTA [place]) Inject into the skin every 21 ( twenty-one) days.   Yes Historical Provider, MD  prochlorperazine (COMPAZINE) 5 MG tablet Take 5 mg by mouth every 6 (six) hours as needed for nausea or vomiting.   Yes Historical Provider, MD  sennosides-docusate sodium (SENOKOT-S) 8.6-50 MG tablet Take 1 tablet by mouth daily as needed for constipation.   Yes Historical Provider, MD  traMADol (ULTRAM) 50 MG tablet Take 50 mg by mouth every 6 (six) hours as needed for pain. 10/07/16  Yes Historical Provider, MD  UNABLE TO FIND R-EPOCH CHEMO regimen every 3 weeks: Drugs in the R-EPOCH combination: R  = Rituximab; E  = Etoposide Phosphate; P  = Prednisone; O  = Vincristine Sulfate (Oncovin); C = Cyclophosphamide; H = Doxorubicin Hydrochloride (Hydroxydaunorubicin)   Yes Historical Provider, MD  Wheat Dextrin (BENEFIBER) POWD See admin instructions. One teaspoonful mixed into 6-8 ounces of water and consumed by mouth once a day   Yes  Historical Provider, MD    Family History History reviewed. No pertinent family history.  Social History Social History  Substance Use Topics  . Smoking status: Never Smoker  . Smokeless tobacco: Never Used  . Alcohol use Yes     Comment: ocassionally     Allergies   Patient has no known allergies.   Review of Systems Review of Systems  Constitutional: Positive for chills, fatigue and fever.  HENT: Negative for congestion, ear pain and sore throat.   Eyes: Negative for pain and visual disturbance.  Respiratory: Negative for cough and shortness of breath.     Cardiovascular: Negative for chest pain and palpitations.  Gastrointestinal: Positive for constipation. Negative for abdominal pain, diarrhea and vomiting.  Genitourinary: Negative for dysuria and hematuria.  Musculoskeletal: Negative for arthralgias and back pain.  Skin: Negative for color change and rash.  Neurological: Negative for seizures, syncope and headaches.  All other systems reviewed and are negative.    Physical Exam Updated Vital Signs BP 101/69   Pulse (!) 120   Temp (S) 99.9 F (37.7 C) (Oral)   Resp (!) 21   Ht 5\' 2"  (1.575 m)   Wt 78.9 kg   SpO2 98%   BMI 31.83 kg/m   Physical Exam  Constitutional: She appears well-developed and well-nourished. She has a sickly appearance. No distress.  HENT:  Head: Normocephalic and atraumatic.  Eyes: Conjunctivae and EOM are normal.  Neck: Neck supple.  Cardiovascular: Regular rhythm.  Tachycardia present.   No murmur heard. Pulmonary/Chest: Effort normal and breath sounds normal. No respiratory distress.  Abdominal: Soft. There is no tenderness.  Musculoskeletal: She exhibits no edema.  Neurological: She is alert.  Skin: Skin is warm and dry.  Psychiatric: She has a normal mood and affect.  Nursing note and vitals reviewed.    ED Treatments / Results  Labs (all labs ordered are listed, but only abnormal results are displayed) Labs Reviewed  URINALYSIS, ROUTINE W REFLEX MICROSCOPIC - Abnormal; Notable for the following:       Result Value   Color, Urine STRAW (*)    All other components within normal limits  COMPREHENSIVE METABOLIC PANEL - Abnormal; Notable for the following:    Chloride 100 (*)    Glucose, Bld 111 (*)    Total Protein 6.3 (*)    All other components within normal limits  CBC WITH DIFFERENTIAL/PLATELET - Abnormal; Notable for the following:    WBC 1.4 (*)    Hemoglobin 11.0 (*)    HCT 32.1 (*)    All other components within normal limits  I-STAT CG4 LACTIC ACID, ED - Abnormal; Notable  for the following:    Lactic Acid, Venous 2.03 (*)    All other components within normal limits  CULTURE, BLOOD (ROUTINE X 2)  CULTURE, BLOOD (ROUTINE X 2)  RESPIRATORY PANEL BY PCR  PREGNANCY, URINE  I-STAT CG4 LACTIC ACID, ED    EKG  EKG Interpretation  Date/Time:  Monday October 28 2016 21:32:44 EDT Ventricular Rate:  143 PR Interval:  118 QRS Duration: 78 QT Interval:  352 QTC Calculation: 543 R Axis:   101 Text Interpretation:  Sinus tachycardia Rightward axis Borderline ECG No previous ECGs available Confirmed by LITTLE MD, RACHEL [phone]) on 10/28/2016 10:48:02 PM       Radiology Dg Chest 2 View  Result Date: 10/28/2016 CLINICAL DATA:  fever and body aches in cancer patient today. Pt is currently on chemotherapy. Hx asthma. EXAM: CHEST  2  VIEW COMPARISON:  None. FINDINGS: Power port type central venous catheter with tip over the cavoatrial junction region. Normal heart size and pulmonary vascularity. No focal airspace disease or consolidation in the lungs. No blunting of costophrenic angles. No pneumothorax. Mediastinal contours appear intact. Azygos lobe. IMPRESSION: No active cardiopulmonary disease. Electronically Signed   By: Lucienne Capers M.D.   On: 10/28/2016 23:09    Procedures Procedures (including critical care time)  Medications Ordered in ED Medications  vancomycin (VANCOCIN) 1,500 mg in sodium chloride 0.9 % 500 mL IVPB (1,500 mg Intravenous New Bag/Given 10/28/16 2352)  sodium chloride 0.9 % bolus 1,000 mL (0 mLs Intravenous Stopped 10/28/16 2358)  ceFEPIme (MAXIPIME) 2 g in dextrose 5 % 50 mL IVPB (0 g Intravenous Stopped 10/28/16 2346)  ketorolac (TORADOL) 30 MG/ML injection 15 mg (15 mg Intravenous Given 10/28/16 2335)  sodium chloride 0.9 % bolus 1,000 mL (1,000 mLs Intravenous New Bag/Given 10/28/16 2357)     Initial Impression / Assessment and Plan / ED Course  I have reviewed the triage vital signs and the nursing notes.  Pertinent labs & imaging  results that were available during my care of the patient were reviewed by me and considered in my medical decision making (see chart for details).  Clinical Course as of Oct 30 22  Mon Oct 28, 2016  2256 DG Chest 2 View [AS]    Clinical Course User Index [AS] Esaw Grandchild, MD    31 year old female with history of lymphoma actively undergoing chemotherapy presents in the setting of neutropenic fever. Patient went to [facility] today for routine labs was found to have neutropenia with platelets account 0.9. On route home patient reports she had generalized body aches was feeling unwell. Patient reported a temperature of 101 at home and for this came to emergency department.  On arrival patient was hemodynamically stable but noted to have tachycardia. Initial oral temperature 99.1. This increased to 99.9. With concern for neutropenic fever blood cultures 2 were obtained as well as repeat basic laboratory analysis, chest x-ray, urinalysis. Patient denies any specific symptoms including chest pain, shortness of breath, rash, recent sick contacts, recent travel, abdominal pain, dysuria.   Labs revealed neutropenia and no signs of pneumonia, UTI or other obvious etiology of patient's symptoms. Respiratory viral panel was sent and results were not available. Discussed case with Dr. Alver Fisher presents at [facility] oncology and the patient will be transferred to [facility] for further management of neutropenic fever.  Patient awaiting transfer at time of transfer of care at 12:30 to Dr. Leonides Schanz.  Final Clinical Impressions(s) / ED Diagnoses   Final diagnoses:  Neutropenic fever [facility])    New Prescriptions New Prescriptions   No medications on file     Esaw Grandchild, MD 10/29/16 [place], MD 10/31/16 [phone]

## 2016-10-28 NOTE — ED Notes (Signed)
Patient transported to X-ray 

## 2016-10-28 NOTE — ED Notes (Signed)
EDP at bedside, Dr. Rex Kras aware of pt lactic acid.

## 2016-10-28 NOTE — ED Notes (Signed)
Phlebotomy at bedside.

## 2016-10-28 NOTE — ED Notes (Addendum)
Cefepime still running at this time, will hang vancomycin when finished.

## 2016-10-29 ENCOUNTER — Encounter (HOSPITAL_COMMUNITY): Payer: Self-pay | Admitting: Family Medicine

## 2016-10-29 DIAGNOSIS — Z79891 Long term (current) use of opiate analgesic: Secondary | ICD-10-CM | POA: Diagnosis not present

## 2016-10-29 DIAGNOSIS — T451X5A Adverse effect of antineoplastic and immunosuppressive drugs, initial encounter: Secondary | ICD-10-CM | POA: Diagnosis not present

## 2016-10-29 DIAGNOSIS — D709 Neutropenia, unspecified: Secondary | ICD-10-CM | POA: Diagnosis present

## 2016-10-29 DIAGNOSIS — D6481 Anemia due to antineoplastic chemotherapy: Secondary | ICD-10-CM | POA: Diagnosis present

## 2016-10-29 DIAGNOSIS — R5081 Fever presenting with conditions classified elsewhere: Secondary | ICD-10-CM | POA: Diagnosis not present

## 2016-10-29 DIAGNOSIS — D696 Thrombocytopenia, unspecified: Secondary | ICD-10-CM | POA: Diagnosis not present

## 2016-10-29 DIAGNOSIS — J45909 Unspecified asthma, uncomplicated: Secondary | ICD-10-CM | POA: Diagnosis not present

## 2016-10-29 DIAGNOSIS — Z79899 Other long term (current) drug therapy: Secondary | ICD-10-CM | POA: Diagnosis not present

## 2016-10-29 DIAGNOSIS — C8330 Diffuse large B-cell lymphoma, unspecified site: Secondary | ICD-10-CM | POA: Diagnosis present

## 2016-10-29 DIAGNOSIS — K5900 Constipation, unspecified: Secondary | ICD-10-CM | POA: Diagnosis not present

## 2016-10-29 DIAGNOSIS — R000 Tachycardia, unspecified: Secondary | ICD-10-CM | POA: Diagnosis not present

## 2016-10-29 HISTORY — DX: Diffuse large B-cell lymphoma, unspecified site: C83.30

## 2016-10-29 HISTORY — DX: Anemia due to antineoplastic chemotherapy: D64.81

## 2016-10-29 HISTORY — DX: Adverse effect of antineoplastic and immunosuppressive drugs, initial encounter: T45.1X5A

## 2016-10-29 LAB — CBC WITH DIFFERENTIAL/PLATELET
Basophils Absolute: 0 10*3/uL (ref 0.0–0.1)
Basophils Relative: 1 %
EOS PCT: 2 %
Eosinophils Absolute: 0 10*3/uL (ref 0.0–0.7)
HCT: 32.1 % — ABNORMAL LOW (ref 36.0–46.0)
Hemoglobin: 11 g/dL — ABNORMAL LOW (ref 12.0–15.0)
Lymphocytes Relative: 40 %
Lymphs Abs: 0.6 10*3/uL — ABNORMAL LOW (ref 0.7–4.0)
MCH: 27.1 pg (ref 26.0–34.0)
MCHC: 34.3 g/dL (ref 30.0–36.0)
MCV: 79.1 fL (ref 78.0–100.0)
MONO ABS: 0.5 10*3/uL (ref 0.1–1.0)
Monocytes Relative: 34 %
NEUTROS ABS: 0.3 10*3/uL — AB (ref 1.7–7.7)
Neutrophils Relative %: 23 %
Platelets: 71 10*3/uL — ABNORMAL LOW (ref 150–400)
RBC: 4.06 MIL/uL (ref 3.87–5.11)
RDW: 14.2 % (ref 11.5–15.5)
WBC: 1.4 10*3/uL — CL (ref 4.0–10.5)

## 2016-10-29 LAB — COMPREHENSIVE METABOLIC PANEL
ALK PHOS: 86 U/L (ref 38–126)
ALT: 35 U/L (ref 14–54)
AST: 22 U/L (ref 15–41)
Albumin: 3.6 g/dL (ref 3.5–5.0)
Anion gap: 10 (ref 5–15)
BUN: 7 mg/dL (ref 6–20)
CHLORIDE: 100 mmol/L — AB (ref 101–111)
CO2: 26 mmol/L (ref 22–32)
Calcium: 9.2 mg/dL (ref 8.9–10.3)
Creatinine, Ser: 0.67 mg/dL (ref 0.44–1.00)
GFR calc Af Amer: >60 mL/min (ref >60)
GFR calc non Af Amer: >60 mL/min (ref >60)
GLUCOSE: 111 mg/dL — AB (ref 65–99)
Potassium: 3.9 mmol/L (ref 3.5–5.1)
Sodium: 136 mmol/L (ref 135–145)
Total Bilirubin: 0.3 mg/dL (ref 0.3–1.2)
Total Protein: 6.3 g/dL — ABNORMAL LOW (ref 6.5–8.1)

## 2016-10-29 LAB — RESPIRATORY PANEL BY PCR
ADENOVIRUS-RVPPCR: NOT DETECTED
Bordetella pertussis: NOT DETECTED
CHLAMYDOPHILA PNEUMONIAE-RVPPCR: NOT DETECTED
CORONAVIRUS 229E-RVPPCR: NOT DETECTED
CORONAVIRUS HKU1-RVPPCR: NOT DETECTED
CORONAVIRUS NL63-RVPPCR: NOT DETECTED
Coronavirus OC43: NOT DETECTED
Influenza A: NOT DETECTED
Influenza B: NOT DETECTED
MYCOPLASMA PNEUMONIAE-RVPPCR: NOT DETECTED
Metapneumovirus: NOT DETECTED
Parainfluenza Virus 1: NOT DETECTED
Parainfluenza Virus 2: NOT DETECTED
Parainfluenza Virus 3: NOT DETECTED
Parainfluenza Virus 4: NOT DETECTED
Respiratory Syncytial Virus: NOT DETECTED
Rhinovirus / Enterovirus: NOT DETECTED

## 2016-10-29 LAB — LACTIC ACID, PLASMA: LACTIC ACID, VENOUS: 1.6 mmol/L (ref 0.5–1.9)

## 2016-10-29 LAB — HIV ANTIBODY (ROUTINE TESTING W REFLEX): HIV Screen 4th Generation wRfx: NONREACTIVE

## 2016-10-29 LAB — PATHOLOGIST SMEAR REVIEW

## 2016-10-29 MED ORDER — SODIUM CHLORIDE 0.9 % IV SOLN
INTRAVENOUS | Status: AC
  Administered 2016-10-29: 03:00:00 via INTRAVENOUS

## 2016-10-29 MED ORDER — ACETAMINOPHEN 650 MG RE SUPP: 650.0000 mg | Freq: Four times a day (QID) | RECTAL | Status: DC | PRN

## 2016-10-29 MED ORDER — SODIUM CHLORIDE 0.9 % IV BOLUS (SEPSIS)
500.0000 mL | Freq: Once | INTRAVENOUS | Status: AC
  Administered 2016-10-29: 500 mL via INTRAVENOUS

## 2016-10-29 MED ORDER — DOCUSATE SODIUM 100 MG PO CAPS: 100.0000 mg | ORAL_CAPSULE | Freq: Every evening | ORAL | Status: DC | PRN

## 2016-10-29 MED ORDER — SODIUM CHLORIDE 0.9% FLUSH
3.0000 mL | Freq: Two times a day (BID) | INTRAVENOUS | Status: DC
  Administered 2016-10-29 – 2016-10-30 (×4): 3 mL via INTRAVENOUS

## 2016-10-29 MED ORDER — ONDANSETRON HCL 4 MG PO TABS: 4.0000 mg | ORAL_TABLET | Freq: Four times a day (QID) | ORAL | Status: DC | PRN

## 2016-10-29 MED ORDER — ACETAMINOPHEN 325 MG PO TABS: 650.0000 mg | ORAL_TABLET | Freq: Four times a day (QID) | ORAL | Status: DC | PRN

## 2016-10-29 MED ORDER — PANTOPRAZOLE SODIUM 40 MG PO TBEC
40.0000 mg | DELAYED_RELEASE_TABLET | Freq: Every day | ORAL | Status: DC
Start: 2016-10-29 — End: 2016-10-30
  Administered 2016-10-29 – 2016-10-30 (×2): 40 mg via ORAL
  Filled 2016-10-29 (×2): qty 1

## 2016-10-29 MED ORDER — ACETAMINOPHEN 500 MG PO TABS
1000.0000 mg | ORAL_TABLET | Freq: Once | ORAL | Status: AC
  Administered 2016-10-29: 1000 mg via ORAL
  Filled 2016-10-29: qty 2

## 2016-10-29 MED ORDER — ONDANSETRON HCL 4 MG/2ML IJ SOLN: 4.0000 mg | Freq: Four times a day (QID) | INTRAMUSCULAR | Status: DC | PRN

## 2016-10-29 MED ORDER — ACYCLOVIR 200 MG PO CAPS
400.0000 mg | ORAL_CAPSULE | Freq: Two times a day (BID) | ORAL | Status: DC
  Administered 2016-10-29 – 2016-10-30 (×3): 400 mg via ORAL
  Filled 2016-10-29 (×3): qty 2

## 2016-10-29 MED ORDER — CEFEPIME HCL 2 G IJ SOLR
2.0000 g | Freq: Three times a day (TID) | INTRAMUSCULAR | Status: DC
  Administered 2016-10-29 – 2016-10-30 (×4): 2 g via INTRAVENOUS
  Filled 2016-10-29 (×5): qty 2

## 2016-10-29 MED ORDER — OXYCODONE HCL 5 MG PO TABS: 5.0000 mg | ORAL_TABLET | ORAL | Status: DC | PRN

## 2016-10-29 MED ORDER — ALBUTEROL SULFATE (2.5 MG/3ML) 0.083% IN NEBU: 2.5000 mg | INHALATION_SOLUTION | Freq: Four times a day (QID) | RESPIRATORY_TRACT | Status: DC | PRN

## 2016-10-29 MED ORDER — SENNOSIDES-DOCUSATE SODIUM 8.6-50 MG PO TABS: 1.0000 | ORAL_TABLET | Freq: Every day | ORAL | Status: DC | PRN

## 2016-10-29 MED ORDER — POLYETHYLENE GLYCOL 3350 17 G PO PACK
17.0000 g | PACK | Freq: Every day | ORAL | Status: DC
  Administered 2016-10-29: 17 g via ORAL
  Filled 2016-10-29 (×2): qty 1

## 2016-10-29 MED ORDER — LORATADINE 10 MG PO TABS
10.0000 mg | ORAL_TABLET | Freq: Every day | ORAL | Status: DC
  Administered 2016-10-29 – 2016-10-30 (×2): 10 mg via ORAL
  Filled 2016-10-29 (×2): qty 1

## 2016-10-29 MED ORDER — FLUCONAZOLE 100 MG PO TABS
400.0000 mg | ORAL_TABLET | Freq: Every day | ORAL | Status: DC
  Administered 2016-10-29 (×2): 400 mg via ORAL
  Filled 2016-10-29 (×2): qty 4

## 2016-10-29 MED ORDER — KETOROLAC TROMETHAMINE 30 MG/ML IJ SOLN: 30.0000 mg | Freq: Four times a day (QID) | INTRAMUSCULAR | Status: DC | PRN

## 2016-10-29 MED ORDER — VANCOMYCIN HCL IN DEXTROSE 1-5 GM/200ML-% IV SOLN: 1000.0000 mg | Freq: Three times a day (TID) | INTRAVENOUS | Status: DC

## 2016-10-29 NOTE — ED Provider Notes (Addendum)
12:50 AM  Patient has history of lymphoma receiving chemotherapy at [facility]. Has neutropenic fever here without obvious source but has received fluids and broad-spectrum antibiotics. Dr. Alfonse Spruce at [facility] oncology has agreed to accept the patient but unfortunate this time there are no beds available at [facility]. Patient will need to be admitted here and transferred once a bed is available. Patient comfortable with this plan. We'll discuss with hospitalist.   Delice Bison Bowman Higbie, DO 10/29/16 0054   1:10 AM Discussed patient's case with hospitalist, Dr. Myna Hidalgo.  I have recommended admission and patient (and family if present) agree with this plan. Admitting physician will place admission orders.   I reviewed all nursing notes, vitals, pertinent previous records, EKGs, lab and urine results, imaging (as available).     [place], DO 10/29/16 0110

## 2016-10-29 NOTE — ED Notes (Signed)
EDP aware of WBC

## 2016-10-29 NOTE — Progress Notes (Signed)
PROGRESS NOTE    Michelle Proctor  OLM:[id number] DOB: Aug 08, 1985 DOA: 10/28/2016 PCP: Sunny Schlein, MD    Brief Narrative:  31 yo female with recent diagnosis of diffuse large B cell lymphoma presented with fever. Completed chemotherapy 2nd cycle on 04.10.2018. Home temperature 101 associated with generalized malaise. Recent blood work with neutropenia, she was advised to com to the hospital.  On the initial evaluation, patient found tachycardic 135 bpm, moist mucous membranes, clear lungs to auscultation, heart tachycardic but regular, abdomen soft and non-tender. [facility] was contacted and accepted the patient but no beds available. Patient was admitted for further treatment.    Assessment & Plan:   Principal Problem:   Neutropenic fever ([place]) Active Problems:   Anemia associated with chemotherapy   Thrombocytopenia ([facility])   DLBCL (diffuse large B cell lymphoma) ([place])   1. Neutropenic fever. Blood cultures continue no growth, will continue cefepime IV. Patient has remained afebrile since admission, (t max 99.9), will continue to follow cell count, continue neutropenic precautions. Patient has been accepted at [facility] but no bed available when called by ED on admission. Plan to contact [facility] in the next 24 hours. WBC at 1,4 with 0.3% PMN. Lactic acid down to 1.6 from 2,0.  2. Lymphoma (large B cell). Recent chemotherapy at [facility], patient did received colony stimulating agents, will continue to follow cell count. Primary hematologist at [facility]. Continue acyclovir and fluconazole.   3. Anemia with thrombocytopenia. Hb at 11 and plt at 71, will continue to follow on cell count, currently no indication to transfuse blood products.    DVT prophylaxis:  Code Status: full  Family Communication: I spoke with patient's family at the bedside and all questions were addressed.  Disposition Plan: home    Consultants:     Procedures:    Antimicrobials:    Cefepime  Acyclovir  Fluconazole    Subjective: Patient feeling better, no nausea or vomiting, no chest pain or dyspnea, no abdominal pain.   Objective: Vitals:   10/29/16 0130 10/29/16 0145 10/29/16 0227 10/29/16 0622  BP: 115/67 115/62 (!) 103/55 (!) 106/59  Pulse: (!) 120 (!) 113 (!) 111 90  Resp:   18 18  Temp:   98.2 F (36.8 C) 98.8 F (37.1 C)  TempSrc:   Oral   SpO2: 98% 97% 98% 100%  Weight:      Height:        Intake/Output Summary (Last 24 hours) at 10/29/16 1149 Last data filed at 10/29/16 0618  Gross per 24 hour  Intake          1555.08 ml  Output                0 ml  Net          1555.08 ml   Filed Weights   10/28/16 2128  Weight: 78.9 kg (174 lb)    Examination:  General exam: not in pain or dyspnea E ENT: positive for pallor, oral mucosa moist, no icterus.  Respiratory system: No wheezing, rales or rhonchi.  Cardiovascular system: S1 & S2 heard, RRR. No JVD, murmurs, rubs, gallops or clicks. No pedal edema. Gastrointestinal system: Abdomen is nondistended, soft and nontender. No organomegaly or masses felt. Normal bowel sounds heard. Central nervous system: Alert and oriented. No focal neurological deficits. Extremities: Symmetric 5 x 5 power. Skin: No rashes, lesions or ulcers    Data Reviewed: I have personally reviewed following labs and imaging studies  CBC:  Recent Labs Lab 10/28/16  2135  WBC 1.4*  NEUTROABS 0.3*  HGB 11.0*  HCT 32.1*  MCV 79.1  PLT 71*   Basic Metabolic Panel:  Recent Labs Lab 10/28/16 2135  NA 136  K 3.9  CL 100*  CO2 26  GLUCOSE 111*  BUN 7  CREATININE 0.67  CALCIUM 9.2   GFR: Estimated Creatinine Clearance: 98.2 mL/min (by C-G formula based on SCr of 0.67 mg/dL). Liver Function Tests:  Recent Labs Lab 10/28/16 2135  AST 22  ALT 35  ALKPHOS 86  BILITOT 0.3  PROT 6.3*  ALBUMIN 3.6   No results for input(s): LIPASE, AMYLASE in the last 168 hours. No results for input(s): AMMONIA in  the last 168 hours. Coagulation Profile: No results for input(s): INR, PROTIME in the last 168 hours. Cardiac Enzymes: No results for input(s): CKTOTAL, CKMB, CKMBINDEX, TROPONINI in the last 168 hours. BNP (last 3 results) No results for input(s): PROBNP in the last 8760 hours. HbA1C: No results for input(s): HGBA1C in the last 72 hours. CBG: No results for input(s): GLUCAP in the last 168 hours. Lipid Profile: No results for input(s): CHOL, HDL, LDLCALC, TRIG, CHOLHDL, LDLDIRECT in the last 72 hours. Thyroid Function Tests: No results for input(s): TSH, T4TOTAL, FREET4, T3FREE, THYROIDAB in the last 72 hours. Anemia Panel: No results for input(s): VITAMINB12, FOLATE, FERRITIN, TIBC, IRON, RETICCTPCT in the last 72 hours. Sepsis Labs:  Recent Labs Lab 10/28/16 2203 10/29/16 0308  LATICACIDVEN 2.03* 1.6    Recent Results (from the past 240 hour(s))  Culture, blood (Routine x 2)     Status: None (Preliminary result)   Collection Time: 10/28/16  9:57 PM  Result Value Ref Range Status   Specimen Description BLOOD RIGHT WRIST  Final   Special Requests   Final    BOTTLES DRAWN AEROBIC AND ANAEROBIC Blood Culture adequate volume   Culture NO GROWTH < 12 HOURS  Final   Report Status PENDING  Incomplete  Culture, blood (Routine x 2)     Status: None (Preliminary result)   Collection Time: 10/28/16 10:25 PM  Result Value Ref Range Status   Specimen Description BLOOD LEFT ANTECUBITAL  Final   Special Requests   Final    BOTTLES DRAWN AEROBIC AND ANAEROBIC Blood Culture adequate volume   Culture NO GROWTH < 12 HOURS  Final   Report Status PENDING  Incomplete  Respiratory Panel by PCR     Status: None   Collection Time: 10/29/16 12:21 AM  Result Value Ref Range Status   Adenovirus NOT DETECTED NOT DETECTED Final   Coronavirus 229E NOT DETECTED NOT DETECTED Final   Coronavirus HKU1 NOT DETECTED NOT DETECTED Final   Coronavirus NL63 NOT DETECTED NOT DETECTED Final   Coronavirus  OC43 NOT DETECTED NOT DETECTED Final   Metapneumovirus NOT DETECTED NOT DETECTED Final   Rhinovirus / Enterovirus NOT DETECTED NOT DETECTED Final   Influenza A NOT DETECTED NOT DETECTED Final   Influenza B NOT DETECTED NOT DETECTED Final   Parainfluenza Virus 1 NOT DETECTED NOT DETECTED Final   Parainfluenza Virus 2 NOT DETECTED NOT DETECTED Final   Parainfluenza Virus 3 NOT DETECTED NOT DETECTED Final   Parainfluenza Virus 4 NOT DETECTED NOT DETECTED Final   Respiratory Syncytial Virus NOT DETECTED NOT DETECTED Final   Bordetella pertussis NOT DETECTED NOT DETECTED Final   Chlamydophila pneumoniae NOT DETECTED NOT DETECTED Final   Mycoplasma pneumoniae NOT DETECTED NOT DETECTED Final         Radiology Studies: Dg Chest  2 View  Result Date: 10/28/2016 CLINICAL DATA:  fever and body aches in cancer patient today. Pt is currently on chemotherapy. Hx asthma. EXAM: CHEST  2 VIEW COMPARISON:  None. FINDINGS: Power port type central venous catheter with tip over the cavoatrial junction region. Normal heart size and pulmonary vascularity. No focal airspace disease or consolidation in the lungs. No blunting of costophrenic angles. No pneumothorax. Mediastinal contours appear intact. Azygos lobe. IMPRESSION: No active cardiopulmonary disease. Electronically Signed   By: Lucienne Capers M.D.   On: 10/28/2016 23:09        Scheduled Meds: . acyclovir  400 mg Oral Q12H  . fluconazole  400 mg Oral QHS  . loratadine  10 mg Oral Daily  . pantoprazole  40 mg Oral Daily  . sodium chloride flush  3 mL Intravenous Q12H   Continuous Infusions: . ceFEPime (MAXIPIME) IV       LOS: 0 days      Tawni Millers, MD [facility] Pager [phone]  If 7PM-7AM, please contact night-coverage [email or web address] Password [facility] 10/29/2016, 11:49 AM

## 2016-10-29 NOTE — H&P (Signed)
History and Physical    Calysta Craigo QIO:[id number] DOB: 11-19-1985 DOA: 10/28/2016  PCP: Sunny Schlein, MD   Patient coming from: Home  Chief Complaint: Fever   HPI: Michelle Proctor is a 31 y.o. female with medical history significant for recent diagnosis of diffuse large B-cell lymphoma, now presenting to the emergency department for evaluation of fevers. Patient was diagnosed with lymphoma approximately 2 months ago, has been under the care of oncology at [facility], and completed her second cycle of chemotherapy with R-EPOCH on 10/22/2016. She has been evaluated back in [facility] since then for blood work and was noted yesterday to be neutropenic with [place] of 100. She was otherwise well and afebrile at that time, but developed fever back at home to 101 accompanied by generalized aches and malaise. Patient took a Norco at home with relief of the aches and improvement in her fever. She followed the directions of her oncologist and came into the ED for evaluation. She denies any recent cough or dyspnea, denies runny nose or sore throat, denies abdominal pain, nausea, vomiting, diarrhea, and denies dysuria or flank pain. There has not been any rash or wound identified. No tenderness or erythema at the IV site. She had been treated with Neulasta.  ED Course: Upon arrival to the ED, patient is found to have temp of 37.7 C, saturating well on room air, tachycardic to 135, and was soft but stable blood pressure. EKG featured a sinus tachycardia with rate 143) right axis deviation. Chest x-ray is negative for acute cardiopulmonary disease. Chemistry panel is unremarkable and CBC is notable for a leukopenia with WBC 1400 and ANC of 322. Hemoglobin appears to be stable at 11.0 and there is also a thrombocytopenia to 71,000 which has improved since the recent blood draw at [facility]. Lactic acid is mildly elevated at 2.03. Urinalysis is unremarkable. Respiratory virus panel was sent and remains pending. Patient was treated with 1 g  of Tylenol, 30 mg IV Toradol, 1 L of normal saline, blood cultures were obtained, and she was started on vancomycin and cefepime empirically. Oncology at [facility] was consulted by the ED physician and they accepted the patient for admission there, but there is no beds available. Tachycardia improved with IV fluids and blood pressure remained stable. Patient has not been in any acute respiratory distress. She will be admitted to the telemetry unit for ongoing evaluation and management of neutropenic fever.  Review of Systems:  All other systems reviewed and apart from HPI, are negative.  Past Medical History:  Diagnosis Date  . Asthma   . Cancer ([place])    limphoma    History reviewed. No pertinent surgical history.   reports that she has never smoked. She has never used smokeless tobacco. She reports that she drinks alcohol. She reports that she does not use drugs.  No Known Allergies  History reviewed. No pertinent family history.   Prior to Admission medications   Medication Sig Start Date End Date Taking? Authorizing Provider  acyclovir (ZOVIRAX) 200 MG capsule Take 400 mg by mouth every 12 (twelve) hours.   Yes Historical Provider, MD  albuterol (VENTOLIN HFA) 108 (90 Base) MCG/ACT inhaler Inhale 2 puffs into the lungs every 6 (six) hours as needed for wheezing or shortness of breath.   Yes Historical Provider, MD  docusate sodium (COLACE) 100 MG capsule Take 100 mg by mouth at bedtime as needed for mild constipation.   Yes Historical Provider, MD  fluconazole (DIFLUCAN) 200 MG tablet Take 400  mg by mouth at bedtime.   Yes Historical Provider, MD  Leuprolide Acetate, 3 Month, (LUPRON DEPOT, 59-MONTH, IM) Inject into the muscle every 3 (three) months.   Yes Historical Provider, MD  loratadine (CLARITIN) 10 MG tablet Take 10 mg by mouth daily.   Yes Historical Provider, MD  ondansetron (ZOFRAN) 4 MG tablet Take 4-8 mg by mouth every 8 (eight) hours as needed for nausea or vomiting.   Yes  Historical Provider, MD  oxyCODONE (OXY IR/ROXICODONE) 5 MG immediate release tablet Take 5 mg by mouth every 6 (six) hours as needed for pain. 10/07/16  Yes Historical Provider, MD  pantoprazole (PROTONIX) 40 MG tablet Take 40 mg by mouth daily.   Yes Historical Provider, MD  Pegfilgrastim (NEULASTA [place]) Inject into the skin every 21 ( twenty-one) days.   Yes Historical Provider, MD  prochlorperazine (COMPAZINE) 5 MG tablet Take 5 mg by mouth every 6 (six) hours as needed for nausea or vomiting.   Yes Historical Provider, MD  sennosides-docusate sodium (SENOKOT-S) 8.6-50 MG tablet Take 1 tablet by mouth daily as needed for constipation.   Yes Historical Provider, MD  traMADol (ULTRAM) 50 MG tablet Take 50 mg by mouth every 6 (six) hours as needed for pain. 10/07/16  Yes Historical Provider, MD  UNABLE TO FIND R-EPOCH CHEMO regimen every 3 weeks: Drugs in the R-EPOCH combination: R  = Rituximab; E  = Etoposide Phosphate; P  = Prednisone; O  = Vincristine Sulfate (Oncovin); C = Cyclophosphamide; H = Doxorubicin Hydrochloride (Hydroxydaunorubicin)   Yes Historical Provider, MD  Wheat Dextrin (BENEFIBER) POWD See admin instructions. One teaspoonful mixed into 6-8 ounces of water and consumed by mouth once a day   Yes Historical Provider, MD    Physical Exam: Vitals:   10/28/16 2358 10/29/16 0000 10/29/16 0015 10/29/16 0030  BP:  106/71 101/69 107/61  Pulse:  (!) 121 (!) 120 (!) 118  Resp:  19 (!) 21 17  Temp: (S) 99.9 F (37.7 C)     TempSrc: Oral     SpO2:  98% 98% 98%  Weight:      Height:          Constitutional: NAD, calm, in apparent discomfort.  Eyes: PERTLA, lids and conjunctivae normal ENMT: Mucous membranes are moist. Posterior pharynx clear of any exudate or lesions.   Neck: normal, supple, no masses, no thyromegaly Respiratory: clear to auscultation bilaterally, no wheezing, no crackles. Normal respiratory effort.    Cardiovascular: Rate ~120 and regular. No extremity edema. No  significant JVD. Abdomen: No distension, no tenderness, no masses palpated. Bowel sounds normal.  Musculoskeletal: no clubbing / cyanosis. No joint deformity upper and lower extremities.    Skin: no significant rashes, lesions, ulcers. Warm, dry, well-perfused. Neurologic: CN 2-12 grossly intact. Sensation intact, DTR normal. Strength 5/5 in all 4 limbs.  Psychiatric: Normal judgment and insight. Alert and oriented x 3. Normal mood and affect.     Labs on Admission: I have personally reviewed following labs and imaging studies  CBC:  Recent Labs Lab 10/28/16 2135  WBC 1.4*  NEUTROABS 0.3*  HGB 11.0*  HCT 32.1*  MCV 79.1  PLT 71*   Basic Metabolic Panel:  Recent Labs Lab 10/28/16 2135  NA 136  K 3.9  CL 100*  CO2 26  GLUCOSE 111*  BUN 7  CREATININE 0.67  CALCIUM 9.2   GFR: Estimated Creatinine Clearance: 98.2 mL/min (by C-G formula based on SCr of 0.67 mg/dL). Liver Function Tests:  Recent Labs Lab 10/28/16 2135  AST 22  ALT 35  ALKPHOS 86  BILITOT 0.3  PROT 6.3*  ALBUMIN 3.6   No results for input(s): LIPASE, AMYLASE in the last 168 hours. No results for input(s): AMMONIA in the last 168 hours. Coagulation Profile: No results for input(s): INR, PROTIME in the last 168 hours. Cardiac Enzymes: No results for input(s): CKTOTAL, CKMB, CKMBINDEX, TROPONINI in the last 168 hours. BNP (last 3 results) No results for input(s): PROBNP in the last 8760 hours. HbA1C: No results for input(s): HGBA1C in the last 72 hours. CBG: No results for input(s): GLUCAP in the last 168 hours. Lipid Profile: No results for input(s): CHOL, HDL, LDLCALC, TRIG, CHOLHDL, LDLDIRECT in the last 72 hours. Thyroid Function Tests: No results for input(s): TSH, T4TOTAL, FREET4, T3FREE, THYROIDAB in the last 72 hours. Anemia Panel: No results for input(s): VITAMINB12, FOLATE, FERRITIN, TIBC, IRON, RETICCTPCT in the last 72 hours. Urine analysis:    Component Value Date/Time    COLORURINE STRAW (A) 10/28/2016 2136   APPEARANCEUR CLEAR 10/28/2016 2136   LABSPEC 1.010 10/28/2016 2136   PHURINE 8.0 10/28/2016 2136   GLUCOSEU NEGATIVE 10/28/2016 2136   HGBUR NEGATIVE 10/28/2016 2136   BILIRUBINUR NEGATIVE 10/28/2016 2136   KETONESUR NEGATIVE 10/28/2016 2136   PROTEINUR NEGATIVE 10/28/2016 2136   NITRITE NEGATIVE 10/28/2016 2136   LEUKOCYTESUR NEGATIVE 10/28/2016 2136   Sepsis Labs: @LABRCNTIP (procalcitonin:4,lacticidven:4) )No results found for this or any previous visit (from the past 240 hour(s)).   Radiological Exams on Admission: Dg Chest 2 View  Result Date: 10/28/2016 CLINICAL DATA:  fever and body aches in cancer patient today. Pt is currently on chemotherapy. Hx asthma. EXAM: CHEST  2 VIEW COMPARISON:  None. FINDINGS: Power port type central venous catheter with tip over the cavoatrial junction region. Normal heart size and pulmonary vascularity. No focal airspace disease or consolidation in the lungs. No blunting of costophrenic angles. No pneumothorax. Mediastinal contours appear intact. Azygos lobe. IMPRESSION: No active cardiopulmonary disease. Electronically Signed   By: Lucienne Capers M.D.   On: 10/28/2016 23:09    EKG: Independently reviewed. Sinus tachycardia (rate 143), RAD   Assessment/Plan  1. Neutropenic fever  - Pt presents with fevers to 101.9 F orally at home; she took a Norco and has temp 99.9 F here  - WBC is 1,400 with ANC 322 on admission, improved from earlier draw at [facility]  - There are no localizing features  - Blood cultures were collected in ED and she was given empiric doses of vancomycin and cefepime  - Plan to continue with cefepime monotherapy while following cultures and monitoring for localizing s/s  - Repeat CBC with diff in am, maintain protective precautions; continue supportive care with antipyretics, IVF, and analgesia   2. Diffuse large B-cell lymphoma  - Diagnosed earlier this year after CXR obtained for  persistent cough  - She is under the care of oncology at [facility] where she completed 2nd of 6 cycles of R-EPOCH last week  - [facility] oncology accepted the patient for admission, but had no bed availability    3. Anemia, thrombocytopenia  - Hgb is stable at 11.0 and there is no apparent bleeding  - Platelets slightly improved at 77,000  - Likely secondary to chemotherapy, will monitor with repeat CBC in am     DVT prophylaxis: SCD's  Code Status: Full  Family Communication: Mother updated at bedside Disposition Plan: Admit to telemetry Consults called: Oncology at [facility]; they accepts pt for  admission, but no beds available there  Admission status: Inpatient    Vianne Bulls, MD [facility] Pager [phone]  If 7PM-7AM, please contact night-coverage [email or web address] Password [email or web address]  10/29/2016, 1:41 AM

## 2016-10-29 NOTE — ED Notes (Signed)
ED Provider at bedside. 

## 2016-10-30 DIAGNOSIS — T451X5A Adverse effect of antineoplastic and immunosuppressive drugs, initial encounter: Secondary | ICD-10-CM

## 2016-10-30 DIAGNOSIS — R5081 Fever presenting with conditions classified elsewhere: Secondary | ICD-10-CM

## 2016-10-30 DIAGNOSIS — C8330 Diffuse large B-cell lymphoma, unspecified site: Secondary | ICD-10-CM

## 2016-10-30 DIAGNOSIS — D6481 Anemia due to antineoplastic chemotherapy: Secondary | ICD-10-CM

## 2016-10-30 DIAGNOSIS — D709 Neutropenia, unspecified: Principal | ICD-10-CM

## 2016-10-30 LAB — CBC WITH DIFFERENTIAL/PLATELET
BAND NEUTROPHILS: 15 %
BASOS ABS: 0 10*3/uL (ref 0.0–0.1)
BASOS PCT: 0 %
BLASTS: 0 %
EOS ABS: 0.1 10*3/uL (ref 0.0–0.7)
Eosinophils Relative: 2 %
HCT: 29 % — ABNORMAL LOW (ref 36.0–46.0)
Hemoglobin: 10 g/dL — ABNORMAL LOW (ref 12.0–15.0)
Lymphocytes Relative: 15 %
Lymphs Abs: 0.8 10*3/uL (ref 0.7–4.0)
MCH: 27.4 pg (ref 26.0–34.0)
MCHC: 34.5 g/dL (ref 30.0–36.0)
MCV: 79.5 fL (ref 78.0–100.0)
METAMYELOCYTES PCT: 3 %
MONO ABS: 0.7 10*3/uL (ref 0.1–1.0)
Monocytes Relative: 13 %
Myelocytes: 7 %
NEUTROS ABS: 3.7 10*3/uL (ref 1.7–7.7)
Neutrophils Relative %: 45 %
Other: 0 %
PLATELETS: 60 10*3/uL — AB (ref 150–400)
PROMYELOCYTES ABS: 0 %
RBC: 3.65 MIL/uL — ABNORMAL LOW (ref 3.87–5.11)
RDW: 14.5 % (ref 11.5–15.5)
WBC: 5.3 10*3/uL (ref 4.0–10.5)
nRBC: 0 /100 WBC

## 2016-10-30 LAB — BASIC METABOLIC PANEL
Anion gap: 6 (ref 5–15)
CALCIUM: 8.9 mg/dL (ref 8.9–10.3)
CHLORIDE: 105 mmol/L (ref 101–111)
CO2: 27 mmol/L (ref 22–32)
CREATININE: 0.58 mg/dL (ref 0.44–1.00)
Glucose, Bld: 109 mg/dL — ABNORMAL HIGH (ref 65–99)
Potassium: 3.8 mmol/L (ref 3.5–5.1)
SODIUM: 138 mmol/L (ref 135–145)

## 2016-10-30 NOTE — Discharge Summary (Signed)
Physician Discharge Summary  Michelle Proctor DUK:[id number] DOB: [birth year] DOA: 10/28/2016  PCP: Sunny Schlein, MD  Admit date: 10/28/2016 Discharge date: 10/30/2016  Time spent: 35 minutes  Recommendations for Outpatient Follow-up:  1. Dr.Ahmed Galal MD in 1 week   Discharge Diagnoses:  Principal Problem:   Neutropenic fever ([place]) Active Problems:   Anemia associated with chemotherapy   Thrombocytopenia ([facility])   DLBCL (diffuse large B cell lymphoma) ([place])   Discharge Condition: stable  Diet recommendation: regular  Filed Weights   10/28/16 2128  Weight: 78.9 kg (174 lb)    History of present illness:   Michelle Proctor is a 31 y.o. female with medical history significant for recent diagnosis of diffuse large B-cell lymphoma, now presenting to the emergency department for evaluation of fevers. Patient was diagnosed with lymphoma approximately 2 months ago, has been under the care of oncology at [facility], and completed her second cycle of chemotherapy with R-EPOCH on 10/22/2016. She has been evaluated back in [facility] since then for blood work and was noted yesterday to be neutropenic with [place] of 100. She was otherwise well and afebrile at that time, but developed fever back at home to 101 accompanied by generalized aches and malaise.  Hospital Course:  1. Neutropenic fever  - suspect acute viral illness - admitted with fever, in setting of neutropenia from recent chemotherapy - she received IVF, broad spectrum Abx, improved, afebrile, all cultures negative, CXR/UA unremarkable - WBC increased to 5.3 from 1.4 on admission, she got G-csf after chemo on 10/23/16 - discharged home in a stable condition after discussing with her Oncologist Dr.Galal at [facility] today  2. Diffuse large B-cell lymphoma  - She is under the care of oncology at [facility] where she completed 2nd of 6 cycles of R-EPOCH last week  - d/w [facility] Oncologist Dr.Galal today, she has a FU at [facility] for PET scan  3. Anemia,  thrombocytopenia  - Hgb is stable at 11.0 and there is no apparent bleeding  - Platelets slightly improved at 77,000  - Likely secondary to chemotherapy, and lymphoma  Consultations:  D/w Dr.Ahmed Galal  Discharge Exam: Vitals:   10/29/16 2136 10/30/16 0619  BP: 108/70 (!) 107/57  Pulse: (!) 104 100  Resp: 20 18  Temp: 98 F (36.7 C) 98.2 F (36.8 C)    General: AAOx3 Cardiovascular: S1S2/RRR Respiratory: CTAB  Discharge Instructions   Discharge Instructions    Diet general    Complete by:  As directed    Increase activity slowly    Complete by:  As directed      Discharge Medication List as of 10/30/2016 11:43 AM    CONTINUE these medications which have NOT CHANGED   Details  acyclovir (ZOVIRAX) 200 MG capsule Take 400 mg by mouth every 12 (twelve) hours., Historical Med    albuterol (VENTOLIN HFA) 108 (90 Base) MCG/ACT inhaler Inhale 2 puffs into the lungs every 6 (six) hours as needed for wheezing or shortness of breath., Historical Med    docusate sodium (COLACE) 100 MG capsule Take 100 mg by mouth at bedtime as needed for mild constipation., Historical Med    fluconazole (DIFLUCAN) 200 MG tablet Take 400 mg by mouth at bedtime., Historical Med    Leuprolide Acetate, 3 Month, (LUPRON DEPOT, 28-MONTH, IM) Inject into the muscle every 3 (three) months., Historical Med    loratadine (CLARITIN) 10 MG tablet Take 10 mg by mouth daily., Historical Med    ondansetron (ZOFRAN) 4 MG tablet Take  4-8 mg by mouth every 8 (eight) hours as needed for nausea or vomiting., Historical Med    oxyCODONE (OXY IR/ROXICODONE) 5 MG immediate release tablet Take 5 mg by mouth every 6 (six) hours as needed for pain., Starting Mon 10/07/2016, Historical Med    pantoprazole (PROTONIX) 40 MG tablet Take 40 mg by mouth daily., Historical Med    Pegfilgrastim (NEULASTA [place]) Inject into the skin every 21 ( twenty-one) days., Historical Med    prochlorperazine (COMPAZINE) 5 MG tablet Take 5  mg by mouth every 6 (six) hours as needed for nausea or vomiting., Historical Med    sennosides-docusate sodium (SENOKOT-S) 8.6-50 MG tablet Take 1 tablet by mouth daily as needed for constipation., Historical Med    traMADol (ULTRAM) 50 MG tablet Take 50 mg by mouth every 6 (six) hours as needed for pain., Starting Mon 10/07/2016, Historical Med    UNABLE TO FIND R-EPOCH CHEMO regimen every 3 weeks: Drugs in the R-EPOCH combination: R  = Rituximab; E  = Etoposide Phosphate; P  = Prednisone; O  = Vincristine Sulfate (Oncovin); C = Cyclophosphamide; H = Doxorubicin Hydrochloride (Hydroxydaunorubicin), Historical M ed    Wheat Dextrin (BENEFIBER) POWD See admin instructions. One teaspoonful mixed into 6-8 ounces of water and consumed by mouth once a day, Historical Med       No Known Allergies Follow-up Information    Oncology Follow up.   Why:  at [facility]           The results of significant diagnostics from this hospitalization (including imaging, microbiology, ancillary and laboratory) are listed below for reference.    Significant Diagnostic Studies: Dg Chest 2 View  Result Date: 10/28/2016 CLINICAL DATA:  fever and body aches in cancer patient today. Pt is currently on chemotherapy. Hx asthma. EXAM: CHEST  2 VIEW COMPARISON:  None. FINDINGS: Power port type central venous catheter with tip over the cavoatrial junction region. Normal heart size and pulmonary vascularity. No focal airspace disease or consolidation in the lungs. No blunting of costophrenic angles. No pneumothorax. Mediastinal contours appear intact. Azygos lobe. IMPRESSION: No active cardiopulmonary disease. Electronically Signed   By: Lucienne Capers M.D.   On: 10/28/2016 23:09    Microbiology: Recent Results (from the past 240 hour(s))  Culture, blood (Routine x 2)     Status: None (Preliminary result)   Collection Time: 10/28/16  9:57 PM  Result Value Ref Range Status   Specimen Description BLOOD RIGHT WRIST   Final   Special Requests   Final    BOTTLES DRAWN AEROBIC AND ANAEROBIC Blood Culture adequate volume   Culture NO GROWTH 2 DAYS  Final   Report Status PENDING  Incomplete  Culture, blood (Routine x 2)     Status: None (Preliminary result)   Collection Time: 10/28/16 10:25 PM  Result Value Ref Range Status   Specimen Description BLOOD LEFT ANTECUBITAL  Final   Special Requests   Final    BOTTLES DRAWN AEROBIC AND ANAEROBIC Blood Culture adequate volume   Culture NO GROWTH 2 DAYS  Final   Report Status PENDING  Incomplete  Respiratory Panel by PCR     Status: None   Collection Time: 10/29/16 12:21 AM  Result Value Ref Range Status   Adenovirus NOT DETECTED NOT DETECTED Final   Coronavirus 229E NOT DETECTED NOT DETECTED Final   Coronavirus HKU1 NOT DETECTED NOT DETECTED Final   Coronavirus NL63 NOT DETECTED NOT DETECTED Final   Coronavirus OC43 NOT DETECTED NOT DETECTED  Final   Metapneumovirus NOT DETECTED NOT DETECTED Final   Rhinovirus / Enterovirus NOT DETECTED NOT DETECTED Final   Influenza A NOT DETECTED NOT DETECTED Final   Influenza B NOT DETECTED NOT DETECTED Final   Parainfluenza Virus 1 NOT DETECTED NOT DETECTED Final   Parainfluenza Virus 2 NOT DETECTED NOT DETECTED Final   Parainfluenza Virus 3 NOT DETECTED NOT DETECTED Final   Parainfluenza Virus 4 NOT DETECTED NOT DETECTED Final   Respiratory Syncytial Virus NOT DETECTED NOT DETECTED Final   Bordetella pertussis NOT DETECTED NOT DETECTED Final   Chlamydophila pneumoniae NOT DETECTED NOT DETECTED Final   Mycoplasma pneumoniae NOT DETECTED NOT DETECTED Final     Labs: Basic Metabolic Panel:  Recent Labs Lab 10/28/16 2135 10/30/16 0614  NA 136 138  K 3.9 3.8  CL 100* 105  CO2 26 27  GLUCOSE 111* 109*  BUN 7 <5*  CREATININE 0.67 0.58  CALCIUM 9.2 8.9   Liver Function Tests:  Recent Labs Lab 10/28/16 2135  AST 22  ALT 35  ALKPHOS 86  BILITOT 0.3  PROT 6.3*  ALBUMIN 3.6   No results for input(s):  LIPASE, AMYLASE in the last 168 hours. No results for input(s): AMMONIA in the last 168 hours. CBC:  Recent Labs Lab 10/28/16 2135 10/30/16 0614  WBC 1.4* 5.3  NEUTROABS 0.3* 3.7  HGB 11.0* 10.0*  HCT 32.1* 29.0*  MCV 79.1 79.5  PLT 71* 60*   Cardiac Enzymes: No results for input(s): CKTOTAL, CKMB, CKMBINDEX, TROPONINI in the last 168 hours. BNP: BNP (last 3 results) No results for input(s): BNP in the last 8760 hours.  ProBNP (last 3 results) No results for input(s): PROBNP in the last 8760 hours.  CBG: No results for input(s): GLUCAP in the last 168 hours.     SignedDomenic Polite MD.  [facility] 10/30/2016, 4:44 PM

## 2016-10-30 NOTE — Progress Notes (Signed)
Michelle Proctor to be D/C'd Home per MD order.  Discussed with the patient and all questions fully answered.  VSS, Skin clean, dry and intact without evidence of skin break down, no evidence of skin tears noted. IV catheter discontinued intact. Site without signs and symptoms of complications. Dressing and pressure applied.  An After Visit Summary was printed and given to the patient. Patient received prescription.  D/c education completed with patient/family including follow up instructions, medication list, d/c activities limitations if indicated, with other d/c instructions as indicated by MD - patient able to verbalize understanding, all questions fully answered.   Patient instructed to return to ED, call 911, or call MD for any changes in condition.   Patient escorted via [place], and D/C home via private auto.  Luci Bank 10/30/2016 11:42 AM

## 2016-10-31 DIAGNOSIS — C8332 Diffuse large B-cell lymphoma, intrathoracic lymph nodes: Secondary | ICD-10-CM | POA: Diagnosis not present

## 2016-10-31 LAB — PATHOLOGIST SMEAR REVIEW

## 2016-11-02 LAB — CULTURE, BLOOD (ROUTINE X 2)
CULTURE: NO GROWTH
CULTURE: NO GROWTH
SPECIAL REQUESTS: ADEQUATE
Special Requests: ADEQUATE

## 2016-11-04 DIAGNOSIS — G620 Drug-induced polyneuropathy: Secondary | ICD-10-CM | POA: Diagnosis not present

## 2016-11-04 DIAGNOSIS — G629 Polyneuropathy, unspecified: Secondary | ICD-10-CM | POA: Diagnosis not present

## 2016-11-04 DIAGNOSIS — C8332 Diffuse large B-cell lymphoma, intrathoracic lymph nodes: Secondary | ICD-10-CM | POA: Diagnosis not present

## 2016-11-04 DIAGNOSIS — Z5111 Encounter for antineoplastic chemotherapy: Secondary | ICD-10-CM | POA: Diagnosis not present

## 2016-11-04 DIAGNOSIS — K581 Irritable bowel syndrome with constipation: Secondary | ICD-10-CM | POA: Diagnosis not present

## 2016-11-04 DIAGNOSIS — J302 Other seasonal allergic rhinitis: Secondary | ICD-10-CM | POA: Diagnosis not present

## 2016-11-04 DIAGNOSIS — C8330 Diffuse large B-cell lymphoma, unspecified site: Secondary | ICD-10-CM | POA: Diagnosis not present

## 2016-11-04 DIAGNOSIS — R432 Parageusia: Secondary | ICD-10-CM | POA: Diagnosis not present

## 2016-11-07 DIAGNOSIS — Z5111 Encounter for antineoplastic chemotherapy: Secondary | ICD-10-CM | POA: Diagnosis not present

## 2016-11-07 DIAGNOSIS — J302 Other seasonal allergic rhinitis: Secondary | ICD-10-CM | POA: Diagnosis not present

## 2016-11-07 DIAGNOSIS — C8332 Diffuse large B-cell lymphoma, intrathoracic lymph nodes: Secondary | ICD-10-CM | POA: Diagnosis not present

## 2016-11-07 DIAGNOSIS — G620 Drug-induced polyneuropathy: Secondary | ICD-10-CM | POA: Diagnosis not present

## 2016-11-09 DIAGNOSIS — C8332 Diffuse large B-cell lymphoma, intrathoracic lymph nodes: Secondary | ICD-10-CM | POA: Diagnosis not present

## 2016-11-09 DIAGNOSIS — J302 Other seasonal allergic rhinitis: Secondary | ICD-10-CM | POA: Diagnosis not present

## 2016-11-09 DIAGNOSIS — G620 Drug-induced polyneuropathy: Secondary | ICD-10-CM | POA: Diagnosis not present

## 2016-11-09 DIAGNOSIS — Z5111 Encounter for antineoplastic chemotherapy: Secondary | ICD-10-CM | POA: Diagnosis not present

## 2016-11-10 DIAGNOSIS — J302 Other seasonal allergic rhinitis: Secondary | ICD-10-CM | POA: Diagnosis not present

## 2016-11-10 DIAGNOSIS — C8332 Diffuse large B-cell lymphoma, intrathoracic lymph nodes: Secondary | ICD-10-CM | POA: Diagnosis not present

## 2016-11-10 DIAGNOSIS — G620 Drug-induced polyneuropathy: Secondary | ICD-10-CM | POA: Diagnosis not present

## 2016-11-10 DIAGNOSIS — Z5111 Encounter for antineoplastic chemotherapy: Secondary | ICD-10-CM | POA: Diagnosis not present

## 2016-11-11 DIAGNOSIS — Z5111 Encounter for antineoplastic chemotherapy: Secondary | ICD-10-CM | POA: Diagnosis not present

## 2016-11-11 DIAGNOSIS — C8332 Diffuse large B-cell lymphoma, intrathoracic lymph nodes: Secondary | ICD-10-CM | POA: Diagnosis not present

## 2016-11-12 DIAGNOSIS — C8332 Diffuse large B-cell lymphoma, intrathoracic lymph nodes: Secondary | ICD-10-CM | POA: Diagnosis not present

## 2016-11-12 DIAGNOSIS — Z5111 Encounter for antineoplastic chemotherapy: Secondary | ICD-10-CM | POA: Diagnosis not present

## 2016-11-15 DIAGNOSIS — I959 Hypotension, unspecified: Secondary | ICD-10-CM | POA: Diagnosis not present

## 2016-11-15 DIAGNOSIS — R5383 Other fatigue: Secondary | ICD-10-CM | POA: Diagnosis not present

## 2016-11-15 DIAGNOSIS — R000 Tachycardia, unspecified: Secondary | ICD-10-CM | POA: Diagnosis not present

## 2016-11-15 DIAGNOSIS — C8332 Diffuse large B-cell lymphoma, intrathoracic lymph nodes: Secondary | ICD-10-CM | POA: Diagnosis not present

## 2016-11-18 DIAGNOSIS — C8332 Diffuse large B-cell lymphoma, intrathoracic lymph nodes: Secondary | ICD-10-CM | POA: Diagnosis not present

## 2016-11-21 DIAGNOSIS — C8332 Diffuse large B-cell lymphoma, intrathoracic lymph nodes: Secondary | ICD-10-CM | POA: Diagnosis not present

## 2016-11-25 DIAGNOSIS — Z5111 Encounter for antineoplastic chemotherapy: Secondary | ICD-10-CM | POA: Diagnosis not present

## 2016-11-25 DIAGNOSIS — K589 Irritable bowel syndrome without diarrhea: Secondary | ICD-10-CM | POA: Diagnosis not present

## 2016-11-25 DIAGNOSIS — R110 Nausea: Secondary | ICD-10-CM | POA: Diagnosis not present

## 2016-11-25 DIAGNOSIS — R202 Paresthesia of skin: Secondary | ICD-10-CM | POA: Diagnosis not present

## 2016-11-25 DIAGNOSIS — R000 Tachycardia, unspecified: Secondary | ICD-10-CM | POA: Diagnosis not present

## 2016-11-25 DIAGNOSIS — R5383 Other fatigue: Secondary | ICD-10-CM | POA: Diagnosis not present

## 2016-11-25 DIAGNOSIS — C8332 Diffuse large B-cell lymphoma, intrathoracic lymph nodes: Secondary | ICD-10-CM | POA: Diagnosis not present

## 2016-11-25 DIAGNOSIS — R200 Anesthesia of skin: Secondary | ICD-10-CM | POA: Diagnosis not present

## 2016-11-25 DIAGNOSIS — J45909 Unspecified asthma, uncomplicated: Secondary | ICD-10-CM | POA: Diagnosis not present

## 2016-11-25 DIAGNOSIS — C8338 Diffuse large B-cell lymphoma, lymph nodes of multiple sites: Secondary | ICD-10-CM | POA: Diagnosis not present

## 2016-11-27 DIAGNOSIS — C8332 Diffuse large B-cell lymphoma, intrathoracic lymph nodes: Secondary | ICD-10-CM | POA: Diagnosis not present

## 2016-11-30 DIAGNOSIS — C8332 Diffuse large B-cell lymphoma, intrathoracic lymph nodes: Secondary | ICD-10-CM | POA: Diagnosis not present

## 2016-12-02 DIAGNOSIS — C8332 Diffuse large B-cell lymphoma, intrathoracic lymph nodes: Secondary | ICD-10-CM | POA: Diagnosis not present

## 2016-12-05 DIAGNOSIS — R42 Dizziness and giddiness: Secondary | ICD-10-CM | POA: Diagnosis not present

## 2016-12-05 DIAGNOSIS — R0602 Shortness of breath: Secondary | ICD-10-CM | POA: Diagnosis not present

## 2016-12-05 DIAGNOSIS — J90 Pleural effusion, not elsewhere classified: Secondary | ICD-10-CM | POA: Diagnosis not present

## 2016-12-05 DIAGNOSIS — I2699 Other pulmonary embolism without acute cor pulmonale: Secondary | ICD-10-CM | POA: Diagnosis not present

## 2016-12-05 DIAGNOSIS — J9859 Other diseases of mediastinum, not elsewhere classified: Secondary | ICD-10-CM | POA: Diagnosis not present

## 2016-12-05 DIAGNOSIS — C8332 Diffuse large B-cell lymphoma, intrathoracic lymph nodes: Secondary | ICD-10-CM | POA: Diagnosis not present

## 2016-12-05 DIAGNOSIS — E041 Nontoxic single thyroid nodule: Secondary | ICD-10-CM | POA: Diagnosis not present

## 2016-12-10 DIAGNOSIS — R509 Fever, unspecified: Secondary | ICD-10-CM | POA: Diagnosis not present

## 2016-12-10 DIAGNOSIS — K589 Irritable bowel syndrome without diarrhea: Secondary | ICD-10-CM | POA: Diagnosis not present

## 2016-12-10 DIAGNOSIS — R05 Cough: Secondary | ICD-10-CM | POA: Diagnosis not present

## 2016-12-10 DIAGNOSIS — C8332 Diffuse large B-cell lymphoma, intrathoracic lymph nodes: Secondary | ICD-10-CM | POA: Diagnosis not present

## 2016-12-10 DIAGNOSIS — D649 Anemia, unspecified: Secondary | ICD-10-CM | POA: Diagnosis not present

## 2016-12-10 DIAGNOSIS — J45909 Unspecified asthma, uncomplicated: Secondary | ICD-10-CM | POA: Diagnosis not present

## 2016-12-10 DIAGNOSIS — J9859 Other diseases of mediastinum, not elsewhere classified: Secondary | ICD-10-CM | POA: Diagnosis not present

## 2016-12-10 DIAGNOSIS — J90 Pleural effusion, not elsewhere classified: Secondary | ICD-10-CM | POA: Diagnosis not present

## 2016-12-10 DIAGNOSIS — T80218A Other infection due to central venous catheter, initial encounter: Secondary | ICD-10-CM | POA: Diagnosis not present

## 2016-12-10 DIAGNOSIS — I2699 Other pulmonary embolism without acute cor pulmonale: Secondary | ICD-10-CM | POA: Diagnosis not present

## 2016-12-10 DIAGNOSIS — T451X5A Adverse effect of antineoplastic and immunosuppressive drugs, initial encounter: Secondary | ICD-10-CM | POA: Diagnosis not present

## 2016-12-10 DIAGNOSIS — C8590 Non-Hodgkin lymphoma, unspecified, unspecified site: Secondary | ICD-10-CM | POA: Diagnosis not present

## 2016-12-10 DIAGNOSIS — R6510 Systemic inflammatory response syndrome (SIRS) of non-infectious origin without acute organ dysfunction: Secondary | ICD-10-CM | POA: Diagnosis not present

## 2016-12-10 DIAGNOSIS — Z95828 Presence of other vascular implants and grafts: Secondary | ICD-10-CM | POA: Diagnosis not present

## 2016-12-10 DIAGNOSIS — R5081 Fever presenting with conditions classified elsewhere: Secondary | ICD-10-CM | POA: Diagnosis not present

## 2016-12-10 DIAGNOSIS — R531 Weakness: Secondary | ICD-10-CM | POA: Diagnosis not present

## 2016-12-10 DIAGNOSIS — Z79899 Other long term (current) drug therapy: Secondary | ICD-10-CM | POA: Diagnosis not present

## 2016-12-10 DIAGNOSIS — C8330 Diffuse large B-cell lymphoma, unspecified site: Secondary | ICD-10-CM | POA: Diagnosis not present

## 2016-12-10 DIAGNOSIS — R000 Tachycardia, unspecified: Secondary | ICD-10-CM | POA: Diagnosis not present

## 2016-12-10 DIAGNOSIS — D6481 Anemia due to antineoplastic chemotherapy: Secondary | ICD-10-CM | POA: Diagnosis not present

## 2016-12-10 DIAGNOSIS — J329 Chronic sinusitis, unspecified: Secondary | ICD-10-CM | POA: Diagnosis not present

## 2016-12-11 DIAGNOSIS — C8332 Diffuse large B-cell lymphoma, intrathoracic lymph nodes: Secondary | ICD-10-CM | POA: Diagnosis not present

## 2016-12-12 ENCOUNTER — Encounter (HOSPITAL_COMMUNITY): Payer: Self-pay

## 2016-12-12 ENCOUNTER — Emergency Department (HOSPITAL_COMMUNITY)
Admission: EM | Admit: 2016-12-12 | Discharge: 2016-12-13 | Disposition: A | Discharge status: Home / Self Care | Payer: BLUE CROSS/BLUE SHIELD | Attending: Emergency Medicine | Admitting: Emergency Medicine

## 2016-12-12 ENCOUNTER — Emergency Department (HOSPITAL_COMMUNITY): Payer: BLUE CROSS/BLUE SHIELD

## 2016-12-12 DIAGNOSIS — Z79899 Other long term (current) drug therapy: Secondary | ICD-10-CM | POA: Insufficient documentation

## 2016-12-12 DIAGNOSIS — Z95828 Presence of other vascular implants and grafts: Secondary | ICD-10-CM | POA: Insufficient documentation

## 2016-12-12 DIAGNOSIS — D649 Anemia, unspecified: Secondary | ICD-10-CM

## 2016-12-12 DIAGNOSIS — R531 Weakness: Secondary | ICD-10-CM | POA: Diagnosis not present

## 2016-12-12 DIAGNOSIS — R05 Cough: Secondary | ICD-10-CM | POA: Insufficient documentation

## 2016-12-12 DIAGNOSIS — C8590 Non-Hodgkin lymphoma, unspecified, unspecified site: Secondary | ICD-10-CM | POA: Diagnosis not present

## 2016-12-12 DIAGNOSIS — J90 Pleural effusion, not elsewhere classified: Secondary | ICD-10-CM | POA: Diagnosis not present

## 2016-12-12 DIAGNOSIS — J45909 Unspecified asthma, uncomplicated: Secondary | ICD-10-CM | POA: Insufficient documentation

## 2016-12-12 DIAGNOSIS — R509 Fever, unspecified: Secondary | ICD-10-CM

## 2016-12-12 LAB — COMPREHENSIVE METABOLIC PANEL
ALBUMIN: 3.8 g/dL (ref 3.5–5.0)
ALK PHOS: 100 U/L (ref 38–126)
ALT: 48 U/L (ref 14–54)
ANION GAP: 9 (ref 5–15)
AST: 24 U/L (ref 15–41)
BILIRUBIN TOTAL: 0.5 mg/dL (ref 0.3–1.2)
BUN: 8 mg/dL (ref 6–20)
CALCIUM: 9 mg/dL (ref 8.9–10.3)
CO2: 24 mmol/L (ref 22–32)
CREATININE: 0.69 mg/dL (ref 0.44–1.00)
Chloride: 102 mmol/L (ref 101–111)
GFR calc Af Amer: >60 mL/min (ref >60)
GFR calc non Af Amer: >60 mL/min (ref >60)
GLUCOSE: 118 mg/dL — AB (ref 65–99)
Potassium: 3.8 mmol/L (ref 3.5–5.1)
Sodium: 135 mmol/L (ref 135–145)
TOTAL PROTEIN: 6.7 g/dL (ref 6.5–8.1)

## 2016-12-12 LAB — CBC WITH DIFFERENTIAL/PLATELET
BASOS PCT: 0 %
Basophils Absolute: 0 10*3/uL (ref 0.0–0.1)
Eosinophils Absolute: 0 10*3/uL (ref 0.0–0.7)
Eosinophils Relative: 0 %
HEMATOCRIT: 27.3 % — AB (ref 36.0–46.0)
Hemoglobin: 9 g/dL — ABNORMAL LOW (ref 12.0–15.0)
LYMPHS ABS: 1.1 10*3/uL (ref 0.7–4.0)
Lymphocytes Relative: 10 %
MCH: 28.6 pg (ref 26.0–34.0)
MCHC: 33 g/dL (ref 30.0–36.0)
MCV: 86.7 fL (ref 78.0–100.0)
MONO ABS: 0.9 10*3/uL (ref 0.1–1.0)
MYELOCYTES: 2 %
Monocytes Relative: 8 %
NEUTROS ABS: 9.1 10*3/uL — AB (ref 1.7–7.7)
NEUTROS PCT: 80 %
Platelets: 253 10*3/uL (ref 150–400)
RBC: 3.15 MIL/uL — ABNORMAL LOW (ref 3.87–5.11)
RDW: 19.4 % — ABNORMAL HIGH (ref 11.5–15.5)
WBC: 11.1 10*3/uL — ABNORMAL HIGH (ref 4.0–10.5)

## 2016-12-12 LAB — URINALYSIS, ROUTINE W REFLEX MICROSCOPIC
BACTERIA UA: NONE SEEN
Bilirubin Urine: NEGATIVE
Glucose, UA: NEGATIVE mg/dL
Hgb urine dipstick: NEGATIVE
KETONES UR: NEGATIVE mg/dL
Leukocytes, UA: NEGATIVE
Nitrite: NEGATIVE
PH: 8 (ref 5.0–8.0)
Protein, ur: NEGATIVE mg/dL
SPECIFIC GRAVITY, URINE: 1.005 (ref 1.005–1.030)

## 2016-12-12 LAB — PROTIME-INR
INR: 0.99
PROTHROMBIN TIME: 13.1 s (ref 11.4–15.2)

## 2016-12-12 LAB — I-STAT CG4 LACTIC ACID, ED: Lactic Acid, Venous: 1.27 mmol/L (ref 0.5–1.9)

## 2016-12-12 LAB — I-STAT BETA HCG BLOOD, ED (MC, WL, AP ONLY)

## 2016-12-12 MED ORDER — ACETAMINOPHEN 325 MG PO TABS
650.0000 mg | ORAL_TABLET | Freq: Once | ORAL | Status: AC | PRN
  Administered 2016-12-12: 650 mg via ORAL
  Filled 2016-12-12: qty 2

## 2016-12-12 MED ORDER — SODIUM CHLORIDE 0.9 % IV BOLUS (SEPSIS)
1000.0000 mL | Freq: Once | INTRAVENOUS | Status: AC
  Administered 2016-12-12: 1000 mL via INTRAVENOUS

## 2016-12-12 NOTE — ED Triage Notes (Signed)
Pt has lymphoma and is being treated at [facility], her last treatment was a week ago but she had a procedure on her port yesterday.  Pt said she felt weak today and her temp at home was 102 Her heartrate is elevated in triage

## 2016-12-12 NOTE — ED Provider Notes (Addendum)
[facility] DEPT Provider Note   CSN: [id number] Arrival date & time: 12/12/16  2115  By signing my name below, I, Michelle Proctor, attest that this documentation has been prepared under the direction and in the presence of Delora Fuel, MD. Electronically Signed: Lise Auer, ED Scribe. 12/13/16. 12:28 AM.  History   Chief Complaint Chief Complaint  Patient presents with  . Fever   The history is provided by the patient and a relative. No language interpreter was used.    HPI Comments: Michelle Proctor is a 31 y.o. female who presents to the Emergency Department complaining of sudden onset, gradually worsening fever that started at 8:30 pm tonight. She notes associated cough,weakness, chills, and a cough with no production. She notes her fever ranged from 101-102.6. Pt also reports the tip of her port was cut off and left in place yesterday. She has a hx of lymphoma and is on her fourth round of chemotherapy at [facility]. Denies sweats , nausea, vomiting, dysuria , or urinary frequency.   Past Medical History:  Diagnosis Date  . Asthma   . Cancer [facility])    limphoma    Patient Active Problem List   Diagnosis Date Noted  . Neutropenic fever ([place]) 10/29/2016  . Anemia associated with chemotherapy 10/29/2016  . Thrombocytopenia ([place]) 10/29/2016  . DLBCL (diffuse large B cell lymphoma) ([place]) 10/29/2016    History reviewed. No pertinent surgical history.  OB History    No data available     Home Medications    Prior to Admission medications   Medication Sig Start Date End Date Taking? Authorizing Provider  acyclovir (ZOVIRAX) 200 MG capsule Take 400 mg by mouth every 12 (twelve) hours.    [provider]  albuterol (VENTOLIN HFA) 108 (90 Base) MCG/ACT inhaler Inhale 2 puffs into the lungs every 6 (six) hours as needed for wheezing or shortness of breath.    [provider]  docusate sodium (COLACE) 100 MG capsule Take 100 mg by mouth at bedtime as needed for mild  constipation.    [provider]  fluconazole (DIFLUCAN) 200 MG tablet Take 400 mg by mouth at bedtime.    [provider]  Leuprolide Acetate, 3 Month, (LUPRON DEPOT, 52-MONTH, IM) Inject into the muscle every 3 (three) months.    [provider]  loratadine (CLARITIN) 10 MG tablet Take 10 mg by mouth daily.    [provider]  ondansetron (ZOFRAN) 4 MG tablet Take 4-8 mg by mouth every 8 (eight) hours as needed for nausea or vomiting.    [provider]  oxyCODONE (OXY IR/ROXICODONE) 5 MG immediate release tablet Take 5 mg by mouth every 6 (six) hours as needed for pain. 10/07/16   [provider]  pantoprazole (PROTONIX) 40 MG tablet Take 40 mg by mouth daily.    [provider]  Pegfilgrastim (NEULASTA [place]) Inject into the skin every 21 ( twenty-one) days.    [provider]  prochlorperazine (COMPAZINE) 5 MG tablet Take 5 mg by mouth every 6 (six) hours as needed for nausea or vomiting.    [provider]  sennosides-docusate sodium (SENOKOT-S) 8.6-50 MG tablet Take 1 tablet by mouth daily as needed for constipation.    [provider]  traMADol (ULTRAM) 50 MG tablet Take 50 mg by mouth every 6 (six) hours as needed for pain. 10/07/16   [provider]  UNABLE TO FIND R-EPOCH CHEMO regimen every 3 weeks: Drugs in the R-EPOCH combination: R  =  Rituximab; E  = Etoposide Phosphate; P  = Prednisone; O  = Vincristine Sulfate (Oncovin); C = Cyclophosphamide; H = Doxorubicin Hydrochloride (Hydroxydaunorubicin)    [provider]  Wheat Dextrin (BENEFIBER) POWD See admin instructions. One teaspoonful mixed into 6-8 ounces of water and consumed by mouth once a day    [provider]    Family History History reviewed. No pertinent family history.  Social History Social History  Substance Use Topics  . Smoking status: Never Smoker  . Smokeless tobacco: Never Used  . Alcohol use Yes      Comment: ocassionally     Allergies   Patient has no known allergies.  Review of Systems Review of Systems  Constitutional: Positive for chills and fever.  Respiratory: Positive for cough.   Gastrointestinal: Negative for abdominal pain, diarrhea, nausea and vomiting.  Genitourinary: Negative for difficulty urinating, dysuria and frequency.  Neurological: Positive for weakness.  All other systems reviewed and are negative.   Physical Exam Updated Vital Signs BP 100/76 (BP Location: Left Arm)   Pulse (!) 120   Temp (!) 102.1 F (38.9 C) (Oral)   Resp 19   Ht 5\' 3"  (1.6 m)   Wt 174 lb (78.9 kg)   LMP 09/25/2016 (Approximate)   SpO2 95%   BMI 30.82 kg/m   Physical Exam  Constitutional: She is oriented to person, place, and time. She appears well-developed and well-nourished.  HENT:  Head: Normocephalic and atraumatic.  Eyes: EOM are normal. Pupils are equal, round, and reactive to light.  Neck: Normal range of motion. Neck supple. No JVD present.  Cardiovascular: Regular rhythm and normal heart sounds.  Tachycardia present.   No murmur heard. Pulmonary/Chest: Effort normal and breath sounds normal. She has no wheezes. She has no rales. She exhibits no tenderness.  Abdominal: Soft. Bowel sounds are normal. She exhibits no distension and no mass. There is no tenderness.  Musculoskeletal: Normal range of motion. She exhibits no edema.  Lymphadenopathy:    She has no cervical adenopathy.  Neurological: She is alert and oriented to person, place, and time. No cranial nerve deficit. She exhibits normal muscle tone. Coordination normal.  Skin: Skin is warm and dry. No rash noted.  Mediport present on the right side of the chest.   Psychiatric: She has a normal mood and affect. Her behavior is normal. Judgment and thought content normal.  Nursing note and vitals reviewed.  ED Treatments / Results  DIAGNOSTIC STUDIES: Oxygen Saturation is 95% on RA, adequate by my  interpretation.   COORDINATION OF CARE: 11:21 PM-Discussed next steps with pt. Pt verbalized understanding and is agreeable with the plan.   Labs (all labs ordered are listed, but only abnormal results are displayed) Labs Reviewed  COMPREHENSIVE METABOLIC PANEL - Abnormal; Notable for the following:       Result Value   Glucose, Bld 118 (*)    All other components within normal limits  CBC WITH DIFFERENTIAL/PLATELET - Abnormal; Notable for the following:    WBC 11.1 (*)    RBC 3.15 (*)    Hemoglobin 9.0 (*)    HCT 27.3 (*)    RDW 19.4 (*)    Neutro Abs 9.1 (*)    All other components within normal limits  URINALYSIS, ROUTINE W REFLEX MICROSCOPIC - Abnormal; Notable for the following:    Squamous Epithelial / LPF 0-5 (*)    All other components within normal limits  CULTURE, BLOOD (ROUTINE X 2)  CULTURE, BLOOD (  ROUTINE X 2)  PROTIME-INR  I-STAT CG4 LACTIC ACID, ED  I-STAT BETA HCG BLOOD, ED ([facility], [facility], [facility] ONLY)  I-STAT CG4 LACTIC ACID, ED   ECG Interpretation:  EKG Interpretation  Date/Time:  Thursday Dec 12 2016 21:32:51 EDT Ventricular Rate:  135 PR Interval:    QRS Duration: 82 QT Interval:  297 QTC Calculation: 446 R Axis:   92 Text Interpretation:  Sinus tachycardia Borderline right axis deviation Low voltage, precordial leads When compared with ECG of 10/28/2016, No significant change was found Confirmed by Delora Fuel ([id number]) on 12/13/2016 1:21:44 AM        Radiology Dg Chest 2 View  Result Date: 12/12/2016 CLINICAL DATA:  Fever history of lymphoma EXAM: CHEST  2 VIEW COMPARISON:  10/28/2016 FINDINGS: Right-sided central venous port tip overlies the mid SVC. Tiny left effusion. No focal consolidation. Normal heart size. No pneumothorax. IMPRESSION: Tiny left effusion.  No acute infiltrate. Electronically Signed   By: Donavan Foil M.D.   On: 12/12/2016 22:36    Procedures Procedures (including critical care time)  Medications Ordered in ED Medications    acetaminophen (TYLENOL) tablet 650 mg (650 mg Oral Given 12/12/16 2301)     Initial Impression / Assessment and Plan / ED Course  I have reviewed the triage vital signs and the nursing notes.  Pertinent labs & imaging results that were available during my care of the patient were reviewed by me and considered in my medical decision making (see chart for details).  Patient with fever currently undergoing chemotherapy for lymphoma. No obvious source of infection. Old records are reviewed confirming admission to [facility] for cycle 4 of R-EPOCH, cardia that do not seem to be related to hydration status. Septic workup is initiated, and is unremarkable. WBC is adequate, so she does not need to be placed on leukopenic protocol. Of note, she had the catheter from her port trimmed yesterday, but this was done endovascularly. She is non-toxic in appearance, and is felt to be safe to discharge home. She has an appointment at [facility], and is to keep that appointment.  Final Clinical Impressions(s) / ED Diagnoses   Final diagnoses:  Fever in adult  Normochromic normocytic anemia    New Prescriptions New Prescriptions   No medications on file   I personally performed the services described in this documentation, which was scribed in my presence. The recorded information has been reviewed and is accurate.       Delora Fuel, MD 67/20/94 [id number]    Delora Fuel, MD 28/36/62 [phone]

## 2016-12-13 DIAGNOSIS — R000 Tachycardia, unspecified: Secondary | ICD-10-CM | POA: Diagnosis not present

## 2016-12-13 DIAGNOSIS — R509 Fever, unspecified: Secondary | ICD-10-CM | POA: Diagnosis not present

## 2016-12-13 DIAGNOSIS — R6510 Systemic inflammatory response syndrome (SIRS) of non-infectious origin without acute organ dysfunction: Secondary | ICD-10-CM | POA: Diagnosis not present

## 2016-12-13 DIAGNOSIS — J9859 Other diseases of mediastinum, not elsewhere classified: Secondary | ICD-10-CM | POA: Diagnosis not present

## 2016-12-13 DIAGNOSIS — C8330 Diffuse large B-cell lymphoma, unspecified site: Secondary | ICD-10-CM | POA: Diagnosis not present

## 2016-12-13 DIAGNOSIS — J90 Pleural effusion, not elsewhere classified: Secondary | ICD-10-CM | POA: Diagnosis not present

## 2016-12-13 NOTE — Discharge Instructions (Signed)
Follow up with your doctors at [facility] tomorrow, as scheduled.

## 2016-12-15 DIAGNOSIS — R509 Fever, unspecified: Secondary | ICD-10-CM | POA: Diagnosis not present

## 2016-12-15 DIAGNOSIS — C8330 Diffuse large B-cell lymphoma, unspecified site: Secondary | ICD-10-CM | POA: Diagnosis not present

## 2016-12-15 DIAGNOSIS — R6510 Systemic inflammatory response syndrome (SIRS) of non-infectious origin without acute organ dysfunction: Secondary | ICD-10-CM | POA: Diagnosis not present

## 2016-12-15 DIAGNOSIS — R000 Tachycardia, unspecified: Secondary | ICD-10-CM | POA: Diagnosis not present

## 2016-12-16 DIAGNOSIS — R000 Tachycardia, unspecified: Secondary | ICD-10-CM | POA: Diagnosis not present

## 2016-12-16 DIAGNOSIS — R509 Fever, unspecified: Secondary | ICD-10-CM | POA: Diagnosis not present

## 2016-12-16 DIAGNOSIS — C8330 Diffuse large B-cell lymphoma, unspecified site: Secondary | ICD-10-CM | POA: Diagnosis not present

## 2016-12-16 DIAGNOSIS — R6510 Systemic inflammatory response syndrome (SIRS) of non-infectious origin without acute organ dysfunction: Secondary | ICD-10-CM | POA: Diagnosis not present

## 2016-12-17 DIAGNOSIS — R6510 Systemic inflammatory response syndrome (SIRS) of non-infectious origin without acute organ dysfunction: Secondary | ICD-10-CM | POA: Diagnosis not present

## 2016-12-17 DIAGNOSIS — R509 Fever, unspecified: Secondary | ICD-10-CM | POA: Diagnosis not present

## 2016-12-17 DIAGNOSIS — R000 Tachycardia, unspecified: Secondary | ICD-10-CM | POA: Diagnosis not present

## 2016-12-17 DIAGNOSIS — C8330 Diffuse large B-cell lymphoma, unspecified site: Secondary | ICD-10-CM | POA: Diagnosis not present

## 2016-12-18 DIAGNOSIS — J329 Chronic sinusitis, unspecified: Secondary | ICD-10-CM | POA: Diagnosis not present

## 2016-12-18 DIAGNOSIS — R000 Tachycardia, unspecified: Secondary | ICD-10-CM | POA: Diagnosis not present

## 2016-12-18 DIAGNOSIS — C8330 Diffuse large B-cell lymphoma, unspecified site: Secondary | ICD-10-CM | POA: Diagnosis not present

## 2016-12-18 DIAGNOSIS — R509 Fever, unspecified: Secondary | ICD-10-CM | POA: Diagnosis not present

## 2016-12-18 DIAGNOSIS — R6510 Systemic inflammatory response syndrome (SIRS) of non-infectious origin without acute organ dysfunction: Secondary | ICD-10-CM | POA: Diagnosis not present

## 2016-12-18 DIAGNOSIS — C8332 Diffuse large B-cell lymphoma, intrathoracic lymph nodes: Secondary | ICD-10-CM | POA: Diagnosis not present

## 2016-12-18 LAB — CULTURE, BLOOD (ROUTINE X 2)
Culture: NO GROWTH
Special Requests: ADEQUATE

## 2016-12-19 DIAGNOSIS — R000 Tachycardia, unspecified: Secondary | ICD-10-CM | POA: Diagnosis not present

## 2016-12-19 DIAGNOSIS — C8330 Diffuse large B-cell lymphoma, unspecified site: Secondary | ICD-10-CM | POA: Diagnosis not present

## 2016-12-19 DIAGNOSIS — R6510 Systemic inflammatory response syndrome (SIRS) of non-infectious origin without acute organ dysfunction: Secondary | ICD-10-CM | POA: Diagnosis not present

## 2016-12-19 DIAGNOSIS — R509 Fever, unspecified: Secondary | ICD-10-CM | POA: Diagnosis not present

## 2016-12-20 DIAGNOSIS — R509 Fever, unspecified: Secondary | ICD-10-CM | POA: Diagnosis not present

## 2016-12-20 DIAGNOSIS — R000 Tachycardia, unspecified: Secondary | ICD-10-CM | POA: Diagnosis not present

## 2016-12-20 DIAGNOSIS — R6510 Systemic inflammatory response syndrome (SIRS) of non-infectious origin without acute organ dysfunction: Secondary | ICD-10-CM | POA: Diagnosis not present

## 2016-12-20 DIAGNOSIS — C8330 Diffuse large B-cell lymphoma, unspecified site: Secondary | ICD-10-CM | POA: Diagnosis not present

## 2016-12-21 DIAGNOSIS — R509 Fever, unspecified: Secondary | ICD-10-CM | POA: Diagnosis not present

## 2016-12-21 DIAGNOSIS — R6510 Systemic inflammatory response syndrome (SIRS) of non-infectious origin without acute organ dysfunction: Secondary | ICD-10-CM | POA: Diagnosis not present

## 2016-12-21 DIAGNOSIS — C8330 Diffuse large B-cell lymphoma, unspecified site: Secondary | ICD-10-CM | POA: Diagnosis not present

## 2016-12-21 DIAGNOSIS — R000 Tachycardia, unspecified: Secondary | ICD-10-CM | POA: Diagnosis not present

## 2016-12-24 DIAGNOSIS — C8332 Diffuse large B-cell lymphoma, intrathoracic lymph nodes: Secondary | ICD-10-CM | POA: Diagnosis not present

## 2016-12-26 DIAGNOSIS — C8332 Diffuse large B-cell lymphoma, intrathoracic lymph nodes: Secondary | ICD-10-CM | POA: Diagnosis not present

## 2016-12-30 DIAGNOSIS — C8332 Diffuse large B-cell lymphoma, intrathoracic lymph nodes: Secondary | ICD-10-CM | POA: Diagnosis not present

## 2017-01-02 DIAGNOSIS — C8332 Diffuse large B-cell lymphoma, intrathoracic lymph nodes: Secondary | ICD-10-CM | POA: Diagnosis not present

## 2017-01-06 DIAGNOSIS — K589 Irritable bowel syndrome without diarrhea: Secondary | ICD-10-CM | POA: Diagnosis not present

## 2017-01-06 DIAGNOSIS — C8330 Diffuse large B-cell lymphoma, unspecified site: Secondary | ICD-10-CM | POA: Diagnosis not present

## 2017-01-06 DIAGNOSIS — C8332 Diffuse large B-cell lymphoma, intrathoracic lymph nodes: Secondary | ICD-10-CM | POA: Diagnosis not present

## 2017-01-06 DIAGNOSIS — Z86711 Personal history of pulmonary embolism: Secondary | ICD-10-CM | POA: Diagnosis not present

## 2017-01-06 DIAGNOSIS — J45909 Unspecified asthma, uncomplicated: Secondary | ICD-10-CM | POA: Diagnosis not present

## 2017-01-06 DIAGNOSIS — Z7901 Long term (current) use of anticoagulants: Secondary | ICD-10-CM | POA: Diagnosis not present

## 2017-01-06 DIAGNOSIS — Z79899 Other long term (current) drug therapy: Secondary | ICD-10-CM | POA: Diagnosis not present

## 2017-01-06 DIAGNOSIS — Z5111 Encounter for antineoplastic chemotherapy: Secondary | ICD-10-CM | POA: Diagnosis not present

## 2017-01-09 DIAGNOSIS — C8332 Diffuse large B-cell lymphoma, intrathoracic lymph nodes: Secondary | ICD-10-CM | POA: Diagnosis not present

## 2017-01-10 DIAGNOSIS — Z5111 Encounter for antineoplastic chemotherapy: Secondary | ICD-10-CM | POA: Diagnosis not present

## 2017-01-10 DIAGNOSIS — C8332 Diffuse large B-cell lymphoma, intrathoracic lymph nodes: Secondary | ICD-10-CM | POA: Diagnosis not present

## 2017-01-11 DIAGNOSIS — C8332 Diffuse large B-cell lymphoma, intrathoracic lymph nodes: Secondary | ICD-10-CM | POA: Diagnosis not present

## 2017-01-12 DIAGNOSIS — C8332 Diffuse large B-cell lymphoma, intrathoracic lymph nodes: Secondary | ICD-10-CM | POA: Diagnosis not present

## 2017-01-13 DIAGNOSIS — C8332 Diffuse large B-cell lymphoma, intrathoracic lymph nodes: Secondary | ICD-10-CM | POA: Diagnosis not present

## 2017-01-13 DIAGNOSIS — Z7901 Long term (current) use of anticoagulants: Secondary | ICD-10-CM | POA: Diagnosis not present

## 2017-01-13 DIAGNOSIS — Z79899 Other long term (current) drug therapy: Secondary | ICD-10-CM | POA: Diagnosis not present

## 2017-01-14 DIAGNOSIS — C8332 Diffuse large B-cell lymphoma, intrathoracic lymph nodes: Secondary | ICD-10-CM | POA: Diagnosis not present

## 2017-01-14 DIAGNOSIS — Z7901 Long term (current) use of anticoagulants: Secondary | ICD-10-CM | POA: Diagnosis not present

## 2017-01-14 DIAGNOSIS — Z79899 Other long term (current) drug therapy: Secondary | ICD-10-CM | POA: Diagnosis not present

## 2017-01-15 DIAGNOSIS — Z3202 Encounter for pregnancy test, result negative: Secondary | ICD-10-CM | POA: Diagnosis not present

## 2017-01-15 DIAGNOSIS — R404 Transient alteration of awareness: Secondary | ICD-10-CM | POA: Diagnosis not present

## 2017-01-15 DIAGNOSIS — C8330 Diffuse large B-cell lymphoma, unspecified site: Secondary | ICD-10-CM | POA: Diagnosis not present

## 2017-01-15 DIAGNOSIS — R55 Syncope and collapse: Secondary | ICD-10-CM | POA: Diagnosis not present

## 2017-01-15 DIAGNOSIS — W1839XA Other fall on same level, initial encounter: Secondary | ICD-10-CM | POA: Diagnosis not present

## 2017-01-15 DIAGNOSIS — Y9301 Activity, walking, marching and hiking: Secondary | ICD-10-CM | POA: Diagnosis not present

## 2017-01-15 DIAGNOSIS — Y999 Unspecified external cause status: Secondary | ICD-10-CM | POA: Diagnosis not present

## 2017-01-16 DIAGNOSIS — R55 Syncope and collapse: Secondary | ICD-10-CM | POA: Diagnosis not present

## 2017-01-16 DIAGNOSIS — R031 Nonspecific low blood-pressure reading: Secondary | ICD-10-CM | POA: Diagnosis not present

## 2017-01-16 DIAGNOSIS — K5900 Constipation, unspecified: Secondary | ICD-10-CM | POA: Diagnosis not present

## 2017-01-16 DIAGNOSIS — C8332 Diffuse large B-cell lymphoma, intrathoracic lymph nodes: Secondary | ICD-10-CM | POA: Diagnosis not present

## 2017-01-20 DIAGNOSIS — C8332 Diffuse large B-cell lymphoma, intrathoracic lymph nodes: Secondary | ICD-10-CM | POA: Diagnosis not present

## 2017-01-24 DIAGNOSIS — R502 Drug induced fever: Secondary | ICD-10-CM | POA: Diagnosis not present

## 2017-01-24 DIAGNOSIS — Z86711 Personal history of pulmonary embolism: Secondary | ICD-10-CM | POA: Diagnosis not present

## 2017-01-24 DIAGNOSIS — J45909 Unspecified asthma, uncomplicated: Secondary | ICD-10-CM | POA: Diagnosis not present

## 2017-01-24 DIAGNOSIS — T458X5A Adverse effect of other primarily systemic and hematological agents, initial encounter: Secondary | ICD-10-CM | POA: Diagnosis not present

## 2017-01-24 DIAGNOSIS — R6889 Other general symptoms and signs: Secondary | ICD-10-CM | POA: Diagnosis not present

## 2017-01-24 DIAGNOSIS — G902 Horner's syndrome: Secondary | ICD-10-CM | POA: Diagnosis not present

## 2017-01-24 DIAGNOSIS — Z9221 Personal history of antineoplastic chemotherapy: Secondary | ICD-10-CM | POA: Diagnosis not present

## 2017-01-24 DIAGNOSIS — K219 Gastro-esophageal reflux disease without esophagitis: Secondary | ICD-10-CM | POA: Diagnosis not present

## 2017-01-24 DIAGNOSIS — R5383 Other fatigue: Secondary | ICD-10-CM | POA: Diagnosis not present

## 2017-01-24 DIAGNOSIS — Z7901 Long term (current) use of anticoagulants: Secondary | ICD-10-CM | POA: Diagnosis not present

## 2017-01-24 DIAGNOSIS — C8330 Diffuse large B-cell lymphoma, unspecified site: Secondary | ICD-10-CM | POA: Diagnosis not present

## 2017-01-24 DIAGNOSIS — R509 Fever, unspecified: Secondary | ICD-10-CM | POA: Diagnosis not present

## 2017-01-24 DIAGNOSIS — R000 Tachycardia, unspecified: Secondary | ICD-10-CM | POA: Diagnosis not present

## 2017-01-24 DIAGNOSIS — C8510 Unspecified B-cell lymphoma, unspecified site: Secondary | ICD-10-CM | POA: Diagnosis not present

## 2017-01-24 DIAGNOSIS — R51 Headache: Secondary | ICD-10-CM | POA: Diagnosis not present

## 2017-01-24 DIAGNOSIS — A0472 Enterocolitis due to Clostridium difficile, not specified as recurrent: Secondary | ICD-10-CM | POA: Diagnosis not present

## 2017-01-24 DIAGNOSIS — T3695XA Adverse effect of unspecified systemic antibiotic, initial encounter: Secondary | ICD-10-CM | POA: Diagnosis not present

## 2017-01-24 DIAGNOSIS — J9859 Other diseases of mediastinum, not elsewhere classified: Secondary | ICD-10-CM | POA: Diagnosis not present

## 2017-01-25 DIAGNOSIS — C8330 Diffuse large B-cell lymphoma, unspecified site: Secondary | ICD-10-CM | POA: Diagnosis not present

## 2017-01-25 DIAGNOSIS — R509 Fever, unspecified: Secondary | ICD-10-CM | POA: Diagnosis not present

## 2017-01-25 DIAGNOSIS — J9859 Other diseases of mediastinum, not elsewhere classified: Secondary | ICD-10-CM | POA: Diagnosis not present

## 2017-01-25 DIAGNOSIS — R6889 Other general symptoms and signs: Secondary | ICD-10-CM | POA: Diagnosis not present

## 2017-01-25 DIAGNOSIS — R000 Tachycardia, unspecified: Secondary | ICD-10-CM | POA: Diagnosis not present

## 2017-01-26 DIAGNOSIS — R51 Headache: Secondary | ICD-10-CM | POA: Diagnosis not present

## 2017-01-26 DIAGNOSIS — R000 Tachycardia, unspecified: Secondary | ICD-10-CM | POA: Diagnosis not present

## 2017-01-26 DIAGNOSIS — R509 Fever, unspecified: Secondary | ICD-10-CM | POA: Diagnosis not present

## 2017-01-26 DIAGNOSIS — R6889 Other general symptoms and signs: Secondary | ICD-10-CM | POA: Diagnosis not present

## 2017-01-26 DIAGNOSIS — G902 Horner's syndrome: Secondary | ICD-10-CM | POA: Diagnosis not present

## 2017-01-26 DIAGNOSIS — C8330 Diffuse large B-cell lymphoma, unspecified site: Secondary | ICD-10-CM | POA: Diagnosis not present

## 2017-01-27 DIAGNOSIS — R000 Tachycardia, unspecified: Secondary | ICD-10-CM | POA: Diagnosis not present

## 2017-01-27 DIAGNOSIS — C8330 Diffuse large B-cell lymphoma, unspecified site: Secondary | ICD-10-CM | POA: Diagnosis not present

## 2017-01-27 DIAGNOSIS — R509 Fever, unspecified: Secondary | ICD-10-CM | POA: Diagnosis not present

## 2017-01-28 DIAGNOSIS — R509 Fever, unspecified: Secondary | ICD-10-CM | POA: Diagnosis not present

## 2017-01-28 DIAGNOSIS — R000 Tachycardia, unspecified: Secondary | ICD-10-CM | POA: Diagnosis not present

## 2017-01-28 DIAGNOSIS — C8330 Diffuse large B-cell lymphoma, unspecified site: Secondary | ICD-10-CM | POA: Diagnosis not present

## 2017-01-29 DIAGNOSIS — C8330 Diffuse large B-cell lymphoma, unspecified site: Secondary | ICD-10-CM | POA: Diagnosis not present

## 2017-01-29 DIAGNOSIS — R509 Fever, unspecified: Secondary | ICD-10-CM | POA: Diagnosis not present

## 2017-01-29 DIAGNOSIS — A0472 Enterocolitis due to Clostridium difficile, not specified as recurrent: Secondary | ICD-10-CM | POA: Diagnosis not present

## 2017-01-29 DIAGNOSIS — R000 Tachycardia, unspecified: Secondary | ICD-10-CM | POA: Diagnosis not present

## 2017-01-30 DIAGNOSIS — R000 Tachycardia, unspecified: Secondary | ICD-10-CM | POA: Diagnosis not present

## 2017-01-30 DIAGNOSIS — C8330 Diffuse large B-cell lymphoma, unspecified site: Secondary | ICD-10-CM | POA: Diagnosis not present

## 2017-01-30 DIAGNOSIS — R509 Fever, unspecified: Secondary | ICD-10-CM | POA: Diagnosis not present

## 2017-01-30 DIAGNOSIS — A0472 Enterocolitis due to Clostridium difficile, not specified as recurrent: Secondary | ICD-10-CM | POA: Diagnosis not present

## 2017-02-19 DIAGNOSIS — C8332 Diffuse large B-cell lymphoma, intrathoracic lymph nodes: Secondary | ICD-10-CM | POA: Diagnosis not present

## 2017-02-20 DIAGNOSIS — C8330 Diffuse large B-cell lymphoma, unspecified site: Secondary | ICD-10-CM | POA: Diagnosis not present

## 2017-02-20 DIAGNOSIS — C8332 Diffuse large B-cell lymphoma, intrathoracic lymph nodes: Secondary | ICD-10-CM | POA: Diagnosis not present

## 2017-02-25 DIAGNOSIS — F4320 Adjustment disorder, unspecified: Secondary | ICD-10-CM | POA: Diagnosis not present

## 2017-02-26 DIAGNOSIS — N898 Other specified noninflammatory disorders of vagina: Secondary | ICD-10-CM | POA: Diagnosis not present

## 2017-02-26 DIAGNOSIS — Z5111 Encounter for antineoplastic chemotherapy: Secondary | ICD-10-CM | POA: Diagnosis not present

## 2017-02-26 DIAGNOSIS — C8332 Diffuse large B-cell lymphoma, intrathoracic lymph nodes: Secondary | ICD-10-CM | POA: Diagnosis not present

## 2017-02-26 DIAGNOSIS — Z5112 Encounter for antineoplastic immunotherapy: Secondary | ICD-10-CM | POA: Diagnosis not present

## 2017-03-04 DIAGNOSIS — Z006 Encounter for examination for normal comparison and control in clinical research program: Secondary | ICD-10-CM | POA: Insufficient documentation

## 2017-03-04 DIAGNOSIS — C8332 Diffuse large B-cell lymphoma, intrathoracic lymph nodes: Secondary | ICD-10-CM | POA: Diagnosis not present

## 2017-03-05 DIAGNOSIS — Z5111 Encounter for antineoplastic chemotherapy: Secondary | ICD-10-CM | POA: Diagnosis not present

## 2017-03-05 DIAGNOSIS — C8332 Diffuse large B-cell lymphoma, intrathoracic lymph nodes: Secondary | ICD-10-CM | POA: Diagnosis not present

## 2017-03-14 DIAGNOSIS — R51 Headache: Secondary | ICD-10-CM | POA: Diagnosis not present

## 2017-03-14 DIAGNOSIS — J029 Acute pharyngitis, unspecified: Secondary | ICD-10-CM | POA: Diagnosis not present

## 2017-03-14 DIAGNOSIS — R000 Tachycardia, unspecified: Secondary | ICD-10-CM | POA: Diagnosis not present

## 2017-03-14 DIAGNOSIS — R509 Fever, unspecified: Secondary | ICD-10-CM | POA: Diagnosis not present

## 2017-03-14 DIAGNOSIS — R05 Cough: Secondary | ICD-10-CM | POA: Diagnosis not present

## 2017-03-14 DIAGNOSIS — C8330 Diffuse large B-cell lymphoma, unspecified site: Secondary | ICD-10-CM | POA: Diagnosis not present

## 2017-03-15 DIAGNOSIS — C8330 Diffuse large B-cell lymphoma, unspecified site: Secondary | ICD-10-CM | POA: Diagnosis not present

## 2017-03-15 DIAGNOSIS — R05 Cough: Secondary | ICD-10-CM | POA: Diagnosis not present

## 2017-03-15 DIAGNOSIS — R509 Fever, unspecified: Secondary | ICD-10-CM | POA: Diagnosis not present

## 2017-03-15 DIAGNOSIS — R51 Headache: Secondary | ICD-10-CM | POA: Diagnosis not present

## 2017-03-16 DIAGNOSIS — C8332 Diffuse large B-cell lymphoma, intrathoracic lymph nodes: Secondary | ICD-10-CM | POA: Diagnosis not present

## 2017-03-16 DIAGNOSIS — R509 Fever, unspecified: Secondary | ICD-10-CM | POA: Diagnosis not present

## 2017-03-16 DIAGNOSIS — C8330 Diffuse large B-cell lymphoma, unspecified site: Secondary | ICD-10-CM | POA: Diagnosis not present

## 2017-03-16 DIAGNOSIS — K589 Irritable bowel syndrome without diarrhea: Secondary | ICD-10-CM | POA: Diagnosis not present

## 2017-03-16 DIAGNOSIS — R05 Cough: Secondary | ICD-10-CM | POA: Diagnosis not present

## 2017-03-16 DIAGNOSIS — J45909 Unspecified asthma, uncomplicated: Secondary | ICD-10-CM | POA: Diagnosis not present

## 2017-03-16 DIAGNOSIS — Z7901 Long term (current) use of anticoagulants: Secondary | ICD-10-CM | POA: Diagnosis not present

## 2017-03-16 DIAGNOSIS — Z86711 Personal history of pulmonary embolism: Secondary | ICD-10-CM | POA: Diagnosis not present

## 2017-03-16 DIAGNOSIS — R6510 Systemic inflammatory response syndrome (SIRS) of non-infectious origin without acute organ dysfunction: Secondary | ICD-10-CM | POA: Diagnosis not present

## 2017-03-16 DIAGNOSIS — R51 Headache: Secondary | ICD-10-CM | POA: Diagnosis not present

## 2017-03-17 DIAGNOSIS — R509 Fever, unspecified: Secondary | ICD-10-CM | POA: Diagnosis not present

## 2017-03-17 DIAGNOSIS — C8330 Diffuse large B-cell lymphoma, unspecified site: Secondary | ICD-10-CM | POA: Diagnosis not present

## 2017-03-18 DIAGNOSIS — R6510 Systemic inflammatory response syndrome (SIRS) of non-infectious origin without acute organ dysfunction: Secondary | ICD-10-CM | POA: Diagnosis not present

## 2017-03-18 DIAGNOSIS — C8330 Diffuse large B-cell lymphoma, unspecified site: Secondary | ICD-10-CM | POA: Diagnosis not present

## 2017-03-18 DIAGNOSIS — R509 Fever, unspecified: Secondary | ICD-10-CM | POA: Diagnosis not present

## 2017-03-19 DIAGNOSIS — C8332 Diffuse large B-cell lymphoma, intrathoracic lymph nodes: Secondary | ICD-10-CM | POA: Diagnosis not present

## 2017-03-20 DIAGNOSIS — Z5112 Encounter for antineoplastic immunotherapy: Secondary | ICD-10-CM | POA: Diagnosis not present

## 2017-03-20 DIAGNOSIS — Z5111 Encounter for antineoplastic chemotherapy: Secondary | ICD-10-CM | POA: Diagnosis not present

## 2017-03-20 DIAGNOSIS — C8332 Diffuse large B-cell lymphoma, intrathoracic lymph nodes: Secondary | ICD-10-CM | POA: Diagnosis not present

## 2017-03-26 DIAGNOSIS — E876 Hypokalemia: Secondary | ICD-10-CM | POA: Diagnosis not present

## 2017-03-26 DIAGNOSIS — C8332 Diffuse large B-cell lymphoma, intrathoracic lymph nodes: Secondary | ICD-10-CM | POA: Diagnosis not present

## 2017-03-26 DIAGNOSIS — Z5111 Encounter for antineoplastic chemotherapy: Secondary | ICD-10-CM | POA: Diagnosis not present

## 2017-04-08 DIAGNOSIS — F4320 Adjustment disorder, unspecified: Secondary | ICD-10-CM | POA: Diagnosis not present

## 2017-04-17 DIAGNOSIS — C8330 Diffuse large B-cell lymphoma, unspecified site: Secondary | ICD-10-CM | POA: Diagnosis not present

## 2017-04-17 DIAGNOSIS — C8332 Diffuse large B-cell lymphoma, intrathoracic lymph nodes: Secondary | ICD-10-CM | POA: Diagnosis not present

## 2017-04-21 DIAGNOSIS — C8332 Diffuse large B-cell lymphoma, intrathoracic lymph nodes: Secondary | ICD-10-CM | POA: Diagnosis not present

## 2017-04-22 DIAGNOSIS — C8330 Diffuse large B-cell lymphoma, unspecified site: Secondary | ICD-10-CM | POA: Diagnosis not present

## 2017-04-22 DIAGNOSIS — C8332 Diffuse large B-cell lymphoma, intrathoracic lymph nodes: Secondary | ICD-10-CM | POA: Diagnosis not present

## 2017-04-24 DIAGNOSIS — F4320 Adjustment disorder, unspecified: Secondary | ICD-10-CM | POA: Diagnosis not present

## 2017-05-01 DIAGNOSIS — C8332 Diffuse large B-cell lymphoma, intrathoracic lymph nodes: Secondary | ICD-10-CM | POA: Diagnosis not present

## 2017-05-01 DIAGNOSIS — C8390 Non-follicular (diffuse) lymphoma, unspecified, unspecified site: Secondary | ICD-10-CM | POA: Diagnosis not present

## 2017-05-01 DIAGNOSIS — Z5112 Encounter for antineoplastic immunotherapy: Secondary | ICD-10-CM | POA: Diagnosis not present

## 2017-05-01 DIAGNOSIS — Z5111 Encounter for antineoplastic chemotherapy: Secondary | ICD-10-CM | POA: Diagnosis not present

## 2017-05-07 DIAGNOSIS — F4320 Adjustment disorder, unspecified: Secondary | ICD-10-CM | POA: Diagnosis not present

## 2017-05-08 DIAGNOSIS — L989 Disorder of the skin and subcutaneous tissue, unspecified: Secondary | ICD-10-CM | POA: Diagnosis not present

## 2017-05-08 DIAGNOSIS — N924 Excessive bleeding in the premenopausal period: Secondary | ICD-10-CM | POA: Diagnosis not present

## 2017-05-08 DIAGNOSIS — C8332 Diffuse large B-cell lymphoma, intrathoracic lymph nodes: Secondary | ICD-10-CM | POA: Diagnosis not present

## 2017-05-08 DIAGNOSIS — L853 Xerosis cutis: Secondary | ICD-10-CM | POA: Diagnosis not present

## 2017-05-08 DIAGNOSIS — Z5111 Encounter for antineoplastic chemotherapy: Secondary | ICD-10-CM | POA: Diagnosis not present

## 2017-05-20 DIAGNOSIS — R509 Fever, unspecified: Secondary | ICD-10-CM | POA: Diagnosis not present

## 2017-05-20 DIAGNOSIS — R5383 Other fatigue: Secondary | ICD-10-CM | POA: Diagnosis not present

## 2017-05-20 DIAGNOSIS — R05 Cough: Secondary | ICD-10-CM | POA: Diagnosis not present

## 2017-05-20 DIAGNOSIS — R51 Headache: Secondary | ICD-10-CM | POA: Diagnosis not present

## 2017-05-20 DIAGNOSIS — A689 Relapsing fever, unspecified: Secondary | ICD-10-CM | POA: Diagnosis not present

## 2017-05-21 DIAGNOSIS — R05 Cough: Secondary | ICD-10-CM | POA: Diagnosis not present

## 2017-05-21 DIAGNOSIS — R509 Fever, unspecified: Secondary | ICD-10-CM | POA: Diagnosis not present

## 2017-05-21 DIAGNOSIS — R5383 Other fatigue: Secondary | ICD-10-CM | POA: Diagnosis not present

## 2017-05-21 DIAGNOSIS — R51 Headache: Secondary | ICD-10-CM | POA: Diagnosis not present

## 2017-05-28 DIAGNOSIS — F4320 Adjustment disorder, unspecified: Secondary | ICD-10-CM | POA: Diagnosis not present

## 2017-06-02 DIAGNOSIS — C8330 Diffuse large B-cell lymphoma, unspecified site: Secondary | ICD-10-CM | POA: Diagnosis not present

## 2017-06-09 DIAGNOSIS — C8330 Diffuse large B-cell lymphoma, unspecified site: Secondary | ICD-10-CM | POA: Diagnosis not present

## 2017-06-09 DIAGNOSIS — J9859 Other diseases of mediastinum, not elsewhere classified: Secondary | ICD-10-CM | POA: Diagnosis not present

## 2017-06-09 DIAGNOSIS — C8590 Non-Hodgkin lymphoma, unspecified, unspecified site: Secondary | ICD-10-CM | POA: Diagnosis not present

## 2017-06-09 DIAGNOSIS — J90 Pleural effusion, not elsewhere classified: Secondary | ICD-10-CM | POA: Diagnosis not present

## 2017-06-11 DIAGNOSIS — Z9221 Personal history of antineoplastic chemotherapy: Secondary | ICD-10-CM | POA: Diagnosis not present

## 2017-06-11 DIAGNOSIS — C8332 Diffuse large B-cell lymphoma, intrathoracic lymph nodes: Secondary | ICD-10-CM | POA: Diagnosis not present

## 2017-06-11 DIAGNOSIS — C8330 Diffuse large B-cell lymphoma, unspecified site: Secondary | ICD-10-CM | POA: Diagnosis not present

## 2017-06-12 DIAGNOSIS — F4320 Adjustment disorder, unspecified: Secondary | ICD-10-CM | POA: Diagnosis not present

## 2017-06-16 DIAGNOSIS — C8330 Diffuse large B-cell lymphoma, unspecified site: Secondary | ICD-10-CM | POA: Diagnosis not present

## 2017-06-16 DIAGNOSIS — Z9221 Personal history of antineoplastic chemotherapy: Secondary | ICD-10-CM | POA: Insufficient documentation

## 2017-06-16 DIAGNOSIS — J9859 Other diseases of mediastinum, not elsewhere classified: Secondary | ICD-10-CM | POA: Diagnosis not present

## 2017-06-16 DIAGNOSIS — A689 Relapsing fever, unspecified: Secondary | ICD-10-CM | POA: Diagnosis not present

## 2017-06-20 DIAGNOSIS — D763 Other histiocytosis syndromes: Secondary | ICD-10-CM | POA: Diagnosis not present

## 2017-06-20 DIAGNOSIS — Z6833 Body mass index (BMI) 33.0-33.9, adult: Secondary | ICD-10-CM | POA: Diagnosis not present

## 2017-06-20 DIAGNOSIS — Z7901 Long term (current) use of anticoagulants: Secondary | ICD-10-CM | POA: Diagnosis not present

## 2017-06-20 DIAGNOSIS — Z86711 Personal history of pulmonary embolism: Secondary | ICD-10-CM | POA: Diagnosis not present

## 2017-06-20 DIAGNOSIS — Z4682 Encounter for fitting and adjustment of non-vascular catheter: Secondary | ICD-10-CM | POA: Diagnosis not present

## 2017-06-20 DIAGNOSIS — K589 Irritable bowel syndrome without diarrhea: Secondary | ICD-10-CM | POA: Diagnosis not present

## 2017-06-20 DIAGNOSIS — C8330 Diffuse large B-cell lymphoma, unspecified site: Secondary | ICD-10-CM | POA: Diagnosis not present

## 2017-06-20 DIAGNOSIS — J9859 Other diseases of mediastinum, not elsewhere classified: Secondary | ICD-10-CM | POA: Diagnosis not present

## 2017-06-20 DIAGNOSIS — E669 Obesity, unspecified: Secondary | ICD-10-CM | POA: Diagnosis not present

## 2017-06-20 DIAGNOSIS — C8332 Diffuse large B-cell lymphoma, intrathoracic lymph nodes: Secondary | ICD-10-CM | POA: Diagnosis not present

## 2017-06-20 DIAGNOSIS — Z9221 Personal history of antineoplastic chemotherapy: Secondary | ICD-10-CM | POA: Diagnosis not present

## 2017-06-20 DIAGNOSIS — K219 Gastro-esophageal reflux disease without esophagitis: Secondary | ICD-10-CM | POA: Diagnosis not present

## 2017-06-20 DIAGNOSIS — J45909 Unspecified asthma, uncomplicated: Secondary | ICD-10-CM | POA: Diagnosis not present

## 2017-06-20 DIAGNOSIS — Z8572 Personal history of non-Hodgkin lymphomas: Secondary | ICD-10-CM | POA: Diagnosis not present

## 2017-06-21 DIAGNOSIS — Z8572 Personal history of non-Hodgkin lymphomas: Secondary | ICD-10-CM | POA: Diagnosis not present

## 2017-06-21 DIAGNOSIS — D763 Other histiocytosis syndromes: Secondary | ICD-10-CM | POA: Diagnosis not present

## 2017-06-25 DIAGNOSIS — F4320 Adjustment disorder, unspecified: Secondary | ICD-10-CM | POA: Diagnosis not present

## 2017-06-30 DIAGNOSIS — C8332 Diffuse large B-cell lymphoma, intrathoracic lymph nodes: Secondary | ICD-10-CM | POA: Diagnosis not present

## 2017-06-30 DIAGNOSIS — R222 Localized swelling, mass and lump, trunk: Secondary | ICD-10-CM | POA: Diagnosis not present

## 2017-07-03 DIAGNOSIS — M9902 Segmental and somatic dysfunction of thoracic region: Secondary | ICD-10-CM | POA: Diagnosis not present

## 2017-07-03 DIAGNOSIS — M9901 Segmental and somatic dysfunction of cervical region: Secondary | ICD-10-CM | POA: Diagnosis not present

## 2017-07-03 DIAGNOSIS — M9903 Segmental and somatic dysfunction of lumbar region: Secondary | ICD-10-CM | POA: Diagnosis not present

## 2017-07-03 DIAGNOSIS — M5386 Other specified dorsopathies, lumbar region: Secondary | ICD-10-CM | POA: Diagnosis not present

## 2017-07-17 DIAGNOSIS — F4320 Adjustment disorder, unspecified: Secondary | ICD-10-CM | POA: Diagnosis not present

## 2017-07-18 DIAGNOSIS — Z713 Dietary counseling and surveillance: Secondary | ICD-10-CM | POA: Diagnosis not present

## 2017-07-18 DIAGNOSIS — E669 Obesity, unspecified: Secondary | ICD-10-CM | POA: Diagnosis not present

## 2017-07-18 DIAGNOSIS — Z8579 Personal history of other malignant neoplasms of lymphoid, hematopoietic and related tissues: Secondary | ICD-10-CM | POA: Diagnosis not present

## 2017-07-30 DIAGNOSIS — Z8572 Personal history of non-Hodgkin lymphomas: Secondary | ICD-10-CM | POA: Diagnosis not present

## 2017-07-30 DIAGNOSIS — Z9221 Personal history of antineoplastic chemotherapy: Secondary | ICD-10-CM | POA: Diagnosis not present

## 2017-07-30 DIAGNOSIS — Z08 Encounter for follow-up examination after completed treatment for malignant neoplasm: Secondary | ICD-10-CM | POA: Diagnosis not present

## 2017-07-30 DIAGNOSIS — C8330 Diffuse large B-cell lymphoma, unspecified site: Secondary | ICD-10-CM | POA: Diagnosis not present

## 2017-08-21 DIAGNOSIS — F4320 Adjustment disorder, unspecified: Secondary | ICD-10-CM | POA: Diagnosis not present

## 2017-09-10 DIAGNOSIS — C8332 Diffuse large B-cell lymphoma, intrathoracic lymph nodes: Secondary | ICD-10-CM | POA: Diagnosis not present

## 2017-09-10 DIAGNOSIS — C8330 Diffuse large B-cell lymphoma, unspecified site: Secondary | ICD-10-CM | POA: Diagnosis not present

## 2017-09-11 DIAGNOSIS — M9903 Segmental and somatic dysfunction of lumbar region: Secondary | ICD-10-CM | POA: Diagnosis not present

## 2017-09-11 DIAGNOSIS — M9901 Segmental and somatic dysfunction of cervical region: Secondary | ICD-10-CM | POA: Diagnosis not present

## 2017-09-12 DIAGNOSIS — F4320 Adjustment disorder, unspecified: Secondary | ICD-10-CM | POA: Diagnosis not present

## 2017-09-15 DIAGNOSIS — M9901 Segmental and somatic dysfunction of cervical region: Secondary | ICD-10-CM | POA: Diagnosis not present

## 2017-09-15 DIAGNOSIS — M9903 Segmental and somatic dysfunction of lumbar region: Secondary | ICD-10-CM | POA: Diagnosis not present

## 2017-09-18 DIAGNOSIS — M9901 Segmental and somatic dysfunction of cervical region: Secondary | ICD-10-CM | POA: Diagnosis not present

## 2017-09-18 DIAGNOSIS — M9903 Segmental and somatic dysfunction of lumbar region: Secondary | ICD-10-CM | POA: Diagnosis not present

## 2017-09-22 DIAGNOSIS — M9903 Segmental and somatic dysfunction of lumbar region: Secondary | ICD-10-CM | POA: Diagnosis not present

## 2017-09-22 DIAGNOSIS — M9901 Segmental and somatic dysfunction of cervical region: Secondary | ICD-10-CM | POA: Diagnosis not present

## 2017-09-24 DIAGNOSIS — M9901 Segmental and somatic dysfunction of cervical region: Secondary | ICD-10-CM | POA: Diagnosis not present

## 2017-09-24 DIAGNOSIS — M9903 Segmental and somatic dysfunction of lumbar region: Secondary | ICD-10-CM | POA: Diagnosis not present

## 2017-09-29 DIAGNOSIS — M9901 Segmental and somatic dysfunction of cervical region: Secondary | ICD-10-CM | POA: Diagnosis not present

## 2017-09-29 DIAGNOSIS — M9903 Segmental and somatic dysfunction of lumbar region: Secondary | ICD-10-CM | POA: Diagnosis not present

## 2017-10-01 DIAGNOSIS — M9901 Segmental and somatic dysfunction of cervical region: Secondary | ICD-10-CM | POA: Diagnosis not present

## 2017-10-01 DIAGNOSIS — M9903 Segmental and somatic dysfunction of lumbar region: Secondary | ICD-10-CM | POA: Diagnosis not present

## 2017-10-06 DIAGNOSIS — M9903 Segmental and somatic dysfunction of lumbar region: Secondary | ICD-10-CM | POA: Diagnosis not present

## 2017-10-06 DIAGNOSIS — M9901 Segmental and somatic dysfunction of cervical region: Secondary | ICD-10-CM | POA: Diagnosis not present

## 2017-10-09 DIAGNOSIS — M9901 Segmental and somatic dysfunction of cervical region: Secondary | ICD-10-CM | POA: Diagnosis not present

## 2017-10-09 DIAGNOSIS — M9903 Segmental and somatic dysfunction of lumbar region: Secondary | ICD-10-CM | POA: Diagnosis not present

## 2017-10-13 DIAGNOSIS — M9903 Segmental and somatic dysfunction of lumbar region: Secondary | ICD-10-CM | POA: Diagnosis not present

## 2017-10-13 DIAGNOSIS — M9901 Segmental and somatic dysfunction of cervical region: Secondary | ICD-10-CM | POA: Diagnosis not present

## 2017-10-20 DIAGNOSIS — M9901 Segmental and somatic dysfunction of cervical region: Secondary | ICD-10-CM | POA: Diagnosis not present

## 2017-10-20 DIAGNOSIS — M9903 Segmental and somatic dysfunction of lumbar region: Secondary | ICD-10-CM | POA: Diagnosis not present

## 2017-10-22 DIAGNOSIS — F4320 Adjustment disorder, unspecified: Secondary | ICD-10-CM | POA: Diagnosis not present

## 2017-10-27 DIAGNOSIS — M9903 Segmental and somatic dysfunction of lumbar region: Secondary | ICD-10-CM | POA: Diagnosis not present

## 2017-10-27 DIAGNOSIS — M9901 Segmental and somatic dysfunction of cervical region: Secondary | ICD-10-CM | POA: Diagnosis not present

## 2017-11-12 DIAGNOSIS — Z79899 Other long term (current) drug therapy: Secondary | ICD-10-CM | POA: Diagnosis not present

## 2017-11-12 DIAGNOSIS — Z7901 Long term (current) use of anticoagulants: Secondary | ICD-10-CM | POA: Diagnosis not present

## 2017-11-12 DIAGNOSIS — F419 Anxiety disorder, unspecified: Secondary | ICD-10-CM | POA: Diagnosis not present

## 2017-11-12 DIAGNOSIS — R05 Cough: Secondary | ICD-10-CM | POA: Diagnosis not present

## 2017-11-12 DIAGNOSIS — Z86718 Personal history of other venous thrombosis and embolism: Secondary | ICD-10-CM | POA: Diagnosis not present

## 2017-11-12 DIAGNOSIS — C8330 Diffuse large B-cell lymphoma, unspecified site: Secondary | ICD-10-CM | POA: Diagnosis not present

## 2017-11-12 DIAGNOSIS — C8332 Diffuse large B-cell lymphoma, intrathoracic lymph nodes: Secondary | ICD-10-CM | POA: Diagnosis not present

## 2017-11-17 DIAGNOSIS — M9903 Segmental and somatic dysfunction of lumbar region: Secondary | ICD-10-CM | POA: Diagnosis not present

## 2017-11-17 DIAGNOSIS — M9901 Segmental and somatic dysfunction of cervical region: Secondary | ICD-10-CM | POA: Diagnosis not present

## 2017-11-19 DIAGNOSIS — F4320 Adjustment disorder, unspecified: Secondary | ICD-10-CM | POA: Diagnosis not present

## 2017-11-26 DIAGNOSIS — Z452 Encounter for adjustment and management of vascular access device: Secondary | ICD-10-CM | POA: Diagnosis not present

## 2017-11-26 DIAGNOSIS — C8330 Diffuse large B-cell lymphoma, unspecified site: Secondary | ICD-10-CM | POA: Diagnosis not present

## 2017-12-15 DIAGNOSIS — M9903 Segmental and somatic dysfunction of lumbar region: Secondary | ICD-10-CM | POA: Diagnosis not present

## 2017-12-15 DIAGNOSIS — M9901 Segmental and somatic dysfunction of cervical region: Secondary | ICD-10-CM | POA: Diagnosis not present

## 2017-12-16 DIAGNOSIS — F4320 Adjustment disorder, unspecified: Secondary | ICD-10-CM | POA: Diagnosis not present

## 2018-01-12 DIAGNOSIS — M9903 Segmental and somatic dysfunction of lumbar region: Secondary | ICD-10-CM | POA: Diagnosis not present

## 2018-01-12 DIAGNOSIS — M9901 Segmental and somatic dysfunction of cervical region: Secondary | ICD-10-CM | POA: Diagnosis not present

## 2018-01-21 DIAGNOSIS — F4320 Adjustment disorder, unspecified: Secondary | ICD-10-CM | POA: Diagnosis not present

## 2018-02-01 NOTE — Progress Notes (Signed)
Michelle Proctor is a 32 y.o. female is here to [place].   Patient Care Team: Briscoe Deutscher, DO as PCP - General (Family Medicine) Sunny Schlein, MD as Referring Physician (Hematology)   History of Present Illness:   Michelle Proctor, CMA acting as scribe for Dr. Briscoe Deutscher.   HPI: Patient in office to establish care. She has been having loose to diarrhea stools around two times a week starting after she finished chemo back in October. No blood in stool or changes in color. She had not noticed any triggers such as food.   States that her brother and father has "stomach issues" and thinks that they have been Dx with IBS. Hx of C diff colitis while hospitalized.   No travel, antibiotic use, bloody stools, or dark tarry stools. No cramping. She has never tried an elimination diet. She has tried probiotics with no help.   Thyroid nodule. Hx of. Ultrasound follow up in 1 year.   Hx of lymphoma. PET scan next week.   Health Maintenance Due  Topic Date Due  . PAP SMEAR  01/05/2016     Depression screen PHQ 2/9 02/02/2018  Decreased Interest 0  Down, Depressed, Hopeless 0  PHQ - 2 Score 0    PMHx, SurgHx, SocialHx, Medications, and Allergies were reviewed in the Visit Navigator and updated as appropriate.   Past Medical History:  Diagnosis Date  . Anemia associated with chemotherapy 10/29/2016  . Asthma   . Chicken pox   . DLBCL (diffuse large B cell lymphoma) ([place]) 10/29/2016  . History of blood transfusion   . Pneumothorax on left 10/05/2016    History reviewed. No pertinent surgical history.   Family History  Problem Relation Age of Onset  . Arthritis Mother   . Arthritis Maternal Grandmother   . Heart attack Maternal Grandfather   . Hyperlipidemia Maternal Grandfather   . Heart attack Paternal Grandfather     Social History   Tobacco Use  . Smoking status: Never Smoker  . Smokeless tobacco: Never Used  Substance Use Topics  . Alcohol use: Yes    Comment:  ocassionally  . Drug use: No    Current Medications and Allergies:   Current Outpatient Medications:  .  albuterol (VENTOLIN HFA) 108 (90 Base) MCG/ACT inhaler, Inhale 2 puffs into the lungs every 6 (six) hours as needed for wheezing or shortness of breath., Disp: 1 Inhaler, Rfl: 2 .  cetirizine (ZYRTEC) 10 MG tablet, Take 10 mg by mouth daily., Disp: , Rfl:   No Known Allergies Review of Systems:   Pertinent items are noted in the HPI. Otherwise, ROS is negative.  Vitals:   Vitals:   02/02/18 1059  BP: 120/64  Pulse: (!) 106  Temp: 98 F (36.7 C)  TempSrc: Oral  SpO2: 98%  Weight: 180 lb 12.8 oz (82 kg)  Height: 5\' 3"  (1.6 m)     Body mass index is 32.03 kg/m.  Physical Exam:   Physical Exam  Constitutional: She appears well-nourished.  HENT:  Head: Normocephalic and atraumatic.  Eyes: Pupils are equal, round, and reactive to light. EOM are normal.  Neck: Normal range of motion. Neck supple.  Cardiovascular: Normal rate, regular rhythm, normal heart sounds and intact distal pulses.  Pulmonary/Chest: Effort normal.  Abdominal: Soft.  Skin: Skin is warm.  Psychiatric: She has a normal mood and affect. Her behavior is normal.  Nursing note and vitals reviewed.  Assessment and Plan:   Jazlynn was seen today  for establish care.  Diagnoses and all orders for this visit:  Diarrhea, chronic, intermittent Comments: Likely IBS but will R/O with stool study. See AVS.  Orders: -     Comprehensive metabolic panel -     CBC with Differential/Platelet -     Lipase -     Celiac Pnl 2 rflx Endomysial Ab Ttr -     Ova and parasite examination; Future -     Stool culture; Future -     C. difficile GDH and Toxin A/B; Future  Thyroid nodule  History of Clostridium difficile colitis    . Reviewed expectations re: course of current medical issues. . Discussed self-management of symptoms. . Outlined signs and symptoms indicating need for more acute  intervention. . Patient verbalized understanding and all questions were answered. Marland Kitchen Health Maintenance issues including appropriate healthy diet, exercise, and smoking avoidance were discussed with patient. . See orders for this visit as documented in the electronic medical record. . Patient received an After Visit Summary.  CMA served as Education administrator during this visit. History, Physical, and Plan performed by medical provider. The above documentation has been reviewed and is accurate and complete. Briscoe Deutscher, D.O.  Briscoe Deutscher, DO [place], [facility] 02/04/2018

## 2018-02-02 ENCOUNTER — Encounter: Payer: Self-pay | Admitting: Family Medicine

## 2018-02-02 ENCOUNTER — Ambulatory Visit (INDEPENDENT_AMBULATORY_CARE_PROVIDER_SITE_OTHER): Payer: BLUE CROSS/BLUE SHIELD | Admitting: Family Medicine

## 2018-02-02 VITALS — BP 120/64 | HR 106 | Temp 98.0°F | Ht 63.0 in | Wt 180.8 lb

## 2018-02-02 DIAGNOSIS — M9901 Segmental and somatic dysfunction of cervical region: Secondary | ICD-10-CM | POA: Diagnosis not present

## 2018-02-02 DIAGNOSIS — Z8619 Personal history of other infectious and parasitic diseases: Secondary | ICD-10-CM

## 2018-02-02 DIAGNOSIS — M9903 Segmental and somatic dysfunction of lumbar region: Secondary | ICD-10-CM | POA: Diagnosis not present

## 2018-02-02 DIAGNOSIS — E041 Nontoxic single thyroid nodule: Secondary | ICD-10-CM | POA: Diagnosis not present

## 2018-02-02 DIAGNOSIS — R197 Diarrhea, unspecified: Secondary | ICD-10-CM

## 2018-02-02 LAB — CBC WITH DIFFERENTIAL/PLATELET
Basophils Absolute: 0 10*3/uL (ref 0.0–0.1)
Basophils Relative: 0.5 % (ref 0.0–3.0)
Eosinophils Absolute: 0.1 10*3/uL (ref 0.0–0.7)
Eosinophils Relative: 1.9 % (ref 0.0–5.0)
HCT: 43.4 % (ref 36.0–46.0)
Hemoglobin: 14.8 g/dL (ref 12.0–15.0)
Lymphocytes Relative: 19.8 % (ref 12.0–46.0)
Lymphs Abs: 1 10*3/uL (ref 0.7–4.0)
MCHC: 34 g/dL (ref 30.0–36.0)
MCV: 85 fl (ref 78.0–100.0)
Monocytes Absolute: 0.5 10*3/uL (ref 0.1–1.0)
Monocytes Relative: 10.4 % (ref 3.0–12.0)
Neutro Abs: 3.3 10*3/uL (ref 1.4–7.7)
Neutrophils Relative %: 67.4 % (ref 43.0–77.0)
Platelets: 246 10*3/uL (ref 150.0–400.0)
RBC: 5.1 Mil/uL (ref 3.87–5.11)
RDW: 13.5 % (ref 11.5–15.5)
WBC: 4.8 10*3/uL (ref 4.0–10.5)

## 2018-02-02 LAB — COMPREHENSIVE METABOLIC PANEL
ALT: 13 U/L (ref 0–35)
AST: 14 U/L (ref 0–37)
Albumin: 4.2 g/dL (ref 3.5–5.2)
Alkaline Phosphatase: 93 U/L (ref 39–117)
BUN: 9 mg/dL (ref 6–23)
CO2: 29 mEq/L (ref 19–32)
Calcium: 9.3 mg/dL (ref 8.4–10.5)
Chloride: 102 mEq/L (ref 96–112)
Creatinine, Ser: 0.73 mg/dL (ref 0.40–1.20)
GFR: 97.13 mL/min (ref >60.00)
Glucose, Bld: 94 mg/dL (ref 70–99)
Potassium: 4.2 mEq/L (ref 3.5–5.1)
Sodium: 138 mEq/L (ref 135–145)
Total Bilirubin: 0.5 mg/dL (ref 0.2–1.2)
Total Protein: 6.5 g/dL (ref 6.0–8.3)

## 2018-02-02 LAB — LIPASE: Lipase: 20 U/L (ref 11.0–59.0)

## 2018-02-02 MED ORDER — ALBUTEROL SULFATE HFA 108 (90 BASE) MCG/ACT IN AERS: 2.0000 | INHALATION_SPRAY | Freq: Four times a day (QID) | RESPIRATORY_TRACT | 2 refills | Status: DC | PRN

## 2018-02-02 NOTE — Patient Instructions (Signed)
Start taking Align and IBgaurd

## 2018-02-03 ENCOUNTER — Other Ambulatory Visit: Payer: BLUE CROSS/BLUE SHIELD

## 2018-02-03 DIAGNOSIS — R197 Diarrhea, unspecified: Secondary | ICD-10-CM | POA: Diagnosis not present

## 2018-02-04 ENCOUNTER — Encounter: Payer: Self-pay | Admitting: Family Medicine

## 2018-02-04 DIAGNOSIS — E041 Nontoxic single thyroid nodule: Secondary | ICD-10-CM | POA: Insufficient documentation

## 2018-02-04 DIAGNOSIS — R197 Diarrhea, unspecified: Secondary | ICD-10-CM | POA: Insufficient documentation

## 2018-02-04 DIAGNOSIS — Z8619 Personal history of other infectious and parasitic diseases: Secondary | ICD-10-CM | POA: Insufficient documentation

## 2018-02-04 LAB — OVA AND PARASITE EXAMINATION
CONCENTRATE RESULT:: NONE SEEN
MICRO NUMBER:: 90870364
SPECIMEN QUALITY:: ADEQUATE
TRICHROME RESULT:: NONE SEEN

## 2018-02-04 LAB — C. DIFFICILE GDH AND TOXIN A/B
GDH ANTIGEN: NOT DETECTED
MICRO NUMBER:: 90870680
SPECIMEN QUALITY:: ADEQUATE
TOXIN A AND B: NOT DETECTED

## 2018-02-07 LAB — STOOL CULTURE
MICRO NUMBER:: 90870351
MICRO NUMBER:: 90870352
MICRO NUMBER:: 90870353
SHIGA RESULT:: NOT DETECTED
SPECIMEN QUALITY:: ADEQUATE
SPECIMEN QUALITY:: ADEQUATE
SPECIMEN QUALITY:: ADEQUATE

## 2018-02-10 ENCOUNTER — Encounter: Payer: Self-pay | Admitting: Family Medicine

## 2018-02-10 ENCOUNTER — Other Ambulatory Visit (HOSPITAL_COMMUNITY)
Admission: RE | Admit: 2018-02-10 | Discharge: 2018-02-10 | Disposition: A | Discharge status: Home / Self Care | Payer: BLUE CROSS/BLUE SHIELD | Source: Ambulatory Visit | Attending: Family Medicine | Admitting: Family Medicine

## 2018-02-10 ENCOUNTER — Ambulatory Visit (INDEPENDENT_AMBULATORY_CARE_PROVIDER_SITE_OTHER): Payer: BLUE CROSS/BLUE SHIELD | Admitting: Family Medicine

## 2018-02-10 VITALS — BP 120/66 | HR 87 | Temp 97.8°F | Ht 63.0 in | Wt 182.6 lb

## 2018-02-10 DIAGNOSIS — Z124 Encounter for screening for malignant neoplasm of cervix: Secondary | ICD-10-CM | POA: Diagnosis not present

## 2018-02-10 DIAGNOSIS — Z0000 Encounter for general adult medical examination without abnormal findings: Secondary | ICD-10-CM | POA: Diagnosis not present

## 2018-02-10 DIAGNOSIS — K582 Mixed irritable bowel syndrome: Secondary | ICD-10-CM

## 2018-02-10 MED ORDER — HYOSCYAMINE SULFATE SL 0.125 MG SL SUBL: SUBLINGUAL_TABLET | SUBLINGUAL | 0 refills | Status: DC

## 2018-02-10 NOTE — Progress Notes (Signed)
Subjective:    Michelle Proctor is a 32 y.o. female and is here for a comprehensive physical exam.  Current Outpatient Medications:  .  albuterol (VENTOLIN HFA) 108 (90 Base) MCG/ACT inhaler, Inhale 2 puffs into the lungs every 6 (six) hours as needed for wheezing or shortness of breath., Disp: 1 Inhaler, Rfl: 2 .  cetirizine (ZYRTEC) 10 MG tablet, Take 10 mg by mouth daily., Disp: , Rfl:   Health Maintenance Due  Topic Date Due  . PAP SMEAR  01/05/2016   PMHx, SurgHx, SocialHx, Medications, and Allergies were reviewed in the Visit Navigator and updated as appropriate.   Past Medical History:  Diagnosis Date  . Anemia associated with chemotherapy 10/29/2016  . Asthma   . Chicken pox   . DLBCL (diffuse large B cell lymphoma) ([place]) 10/29/2016  . History of blood transfusion   . Pneumothorax on left 10/05/2016   History reviewed. No pertinent surgical history.   Family History  Problem Relation Age of Onset  . Arthritis Mother   . Arthritis Maternal Grandmother   . Heart attack Maternal Grandfather   . Hyperlipidemia Maternal Grandfather   . Heart attack Paternal Grandfather    Social History   Tobacco Use  . Smoking status: Never Smoker  . Smokeless tobacco: Never Used  Substance Use Topics  . Alcohol use: Yes    Comment: ocassionally  . Drug use: No   Review of Systems:   Pertinent items are noted in the HPI. Otherwise, ROS is negative.  Objective:   BP 120/66   Pulse 87   Temp 97.8 F (36.6 C) (Oral)   Ht 5\' 3"  (1.6 m)   Wt 182 lb 9.6 oz (82.8 kg)   LMP 01/28/2018   SpO2 97%   BMI 32.35 kg/m    General appearance: alert, cooperative and appears stated age. Head: normocephalic, without obvious abnormality, atraumatic. Neck: no adenopathy, supple, symmetrical, trachea midline; thyroid not enlarged, symmetric, no tenderness/mass/nodules. Lungs: clear to auscultation bilaterally. Heart: regular rate and rhythm Abdomen: soft, non-tender; no masses,  no  organomegaly. Extremities: extremities normal, atraumatic, no cyanosis or edema. Skin: skin color, texture, turgor normal, no rashes or lesions. Lymph: cervical, supraclavicular, and axillary nodes normal; no abnormal inguinal nodes palpated. Neurologic: grossly normal.  Pelvic:  External genitalia: no lesions.              Urethra: normal appearing urethra with no masses, tenderness or lesions.              Bartholins and Skenes: normal.               Vagina: normal appearing vagina with normal color and discharge, no lesions.              Cervix: normal appearance.              Pap and high risk HPV testing done: Yes.          Bimanual Exam:   Uterus: uterus is normal size, shape, consistency and nontender.                                      Adnexa: normal adnexa in size, nontender and no masses.  Assessment/Plan:   Michelle Proctor was seen today for annual exam.  Diagnoses and all orders for this visit:  Routine physical examination  Pap smear for cervical cancer screening -  Cytology - PAP  Irritable bowel syndrome with both constipation and diarrhea -     Hyoscyamine Sulfate SL (LEVSIN/SL) 0.125 MG SUBL; 1 po every 8 hours prn    Patient Counseling:   [x]     Nutrition: Stressed importance of moderation in sodium/caffeine intake, saturated fat and cholesterol, caloric balance, sufficient intake of fresh fruits, vegetables, fiber, calcium, iron, and 1 mg of folate supplement per day (for females capable of pregnancy).   [x]      Stressed the importance of regular exercise.    [x]     Substance Abuse: Discussed cessation/primary prevention of tobacco, alcohol, or other drug use; driving or other dangerous activities under the influence; availability of treatment for abuse.    [x]      Injury prevention: Discussed safety belts, safety helmets, smoke detector, smoking near bedding or upholstery.    [x]      Sexuality: Discussed sexually transmitted diseases, partner selection,  use of condoms, avoidance of unintended pregnancy  and contraceptive alternatives.    [x]     Dental health: Discussed importance of regular tooth brushing, flossing, and dental visits.   [x]      Health maintenance and immunizations reviewed. Please refer to Health maintenance section.   Briscoe Deutscher, DO [place]

## 2018-02-11 DIAGNOSIS — C8332 Diffuse large B-cell lymphoma, intrathoracic lymph nodes: Secondary | ICD-10-CM | POA: Diagnosis not present

## 2018-02-11 DIAGNOSIS — C8330 Diffuse large B-cell lymphoma, unspecified site: Secondary | ICD-10-CM | POA: Diagnosis not present

## 2018-02-12 LAB — CELIAC PNL 2 RFLX ENDOMYSIAL AB TTR
(tTG) Ab, IgA: <1 U/mL
(tTG) Ab, IgG: <1 U/mL
Endomysial Ab IgA: NEGATIVE
Gliadin IgA: 4 U (ref <20)
Gliadin IgG: 3 U (ref <20)
Immunoglobulin A: 112 mg/dL (ref 47–310)

## 2018-02-13 LAB — CYTOLOGY - PAP
Bacterial vaginitis: NEGATIVE
Candida vaginitis: NEGATIVE
Chlamydia: NEGATIVE
Diagnosis: NEGATIVE
HPV: NOT DETECTED
Neisseria Gonorrhea: NEGATIVE
Trichomonas: NEGATIVE

## 2018-02-17 DIAGNOSIS — F4320 Adjustment disorder, unspecified: Secondary | ICD-10-CM | POA: Diagnosis not present

## 2018-02-22 IMAGING — DX DG CHEST 2V
2 series · 2 of 2 positions shown · non-contrast
Comparison: None.

CLINICAL DATA: fever and body aches in cancer patient today. Pt is
currently on chemotherapy. Hx asthma.

EXAM:
CHEST  2 VIEW

[chest pa]
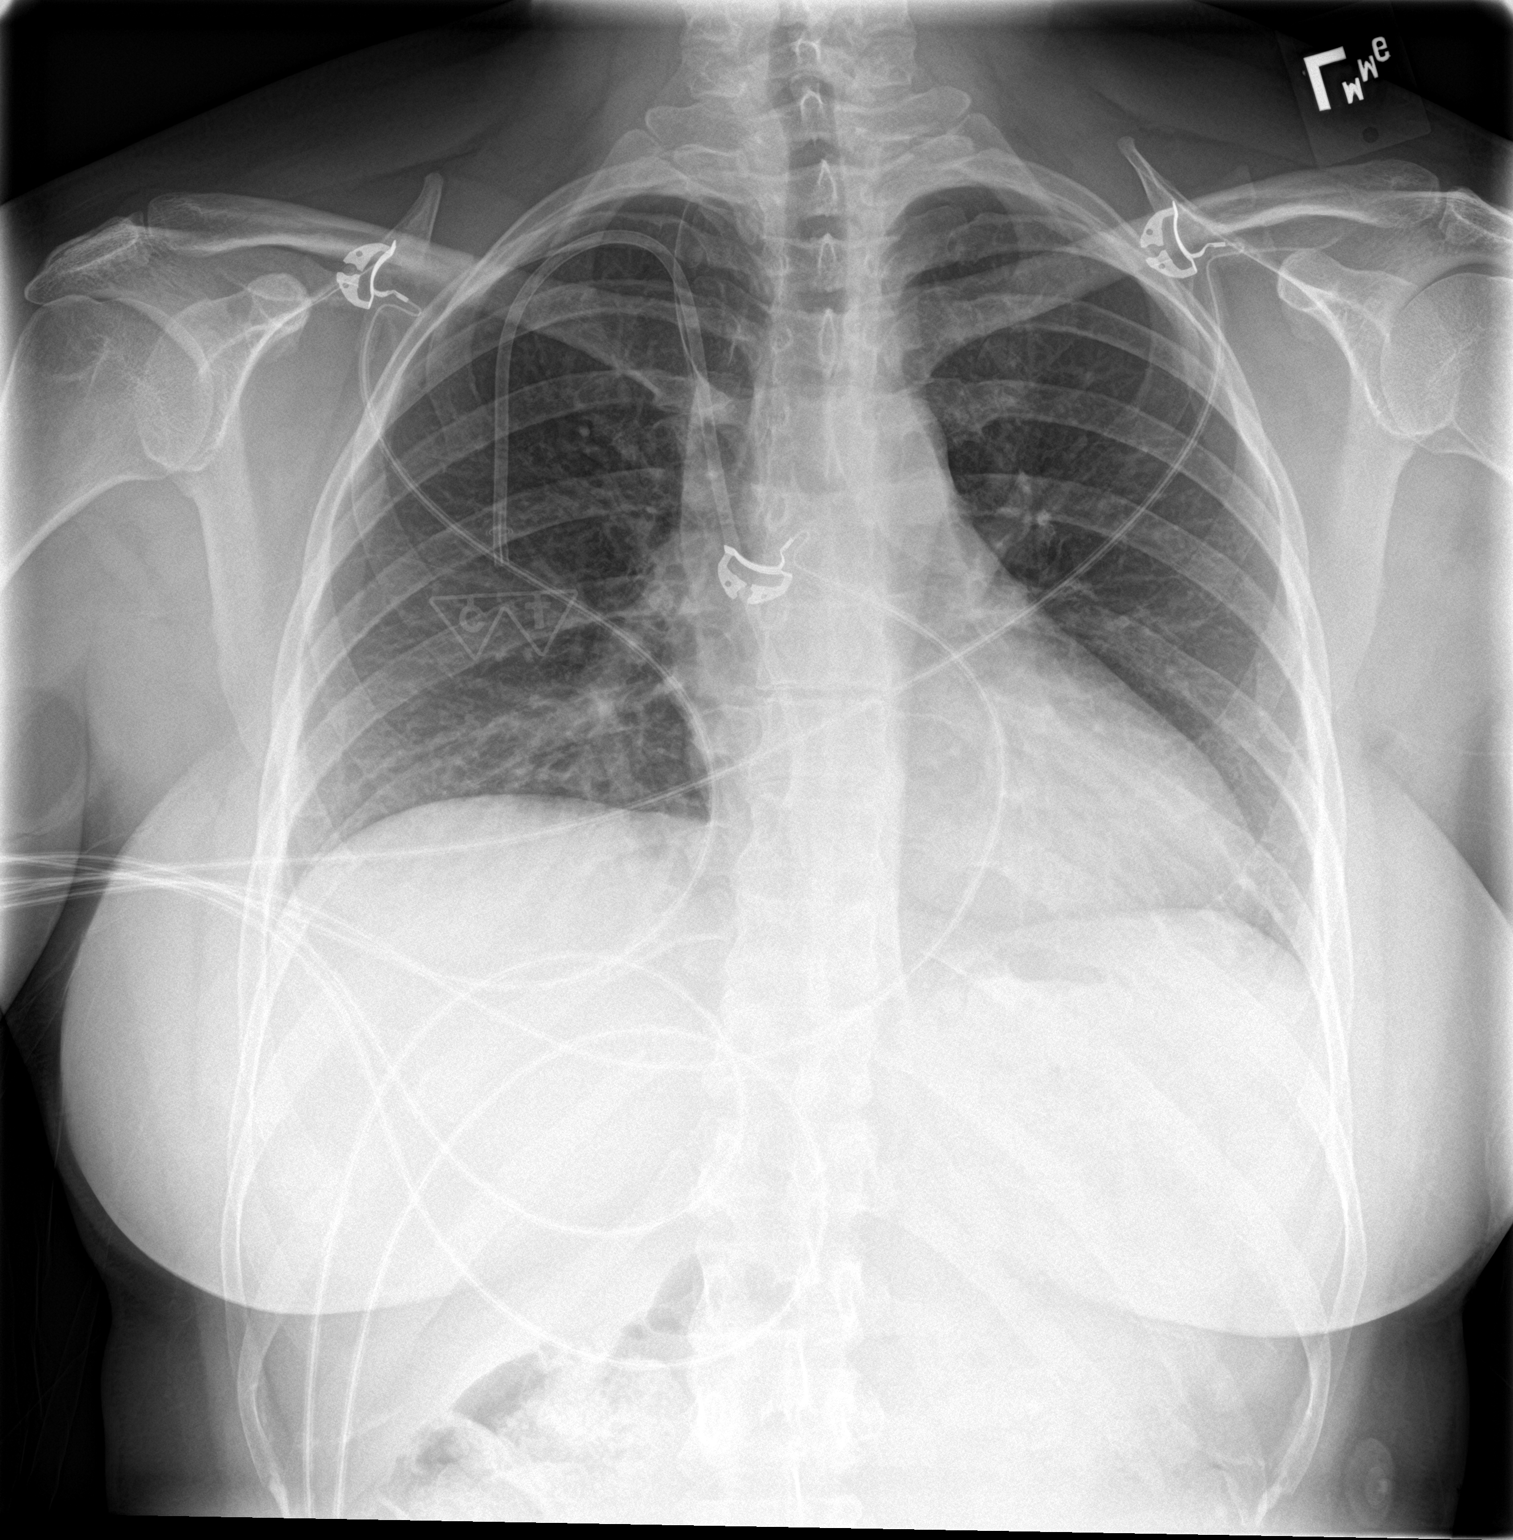

[chest lat]
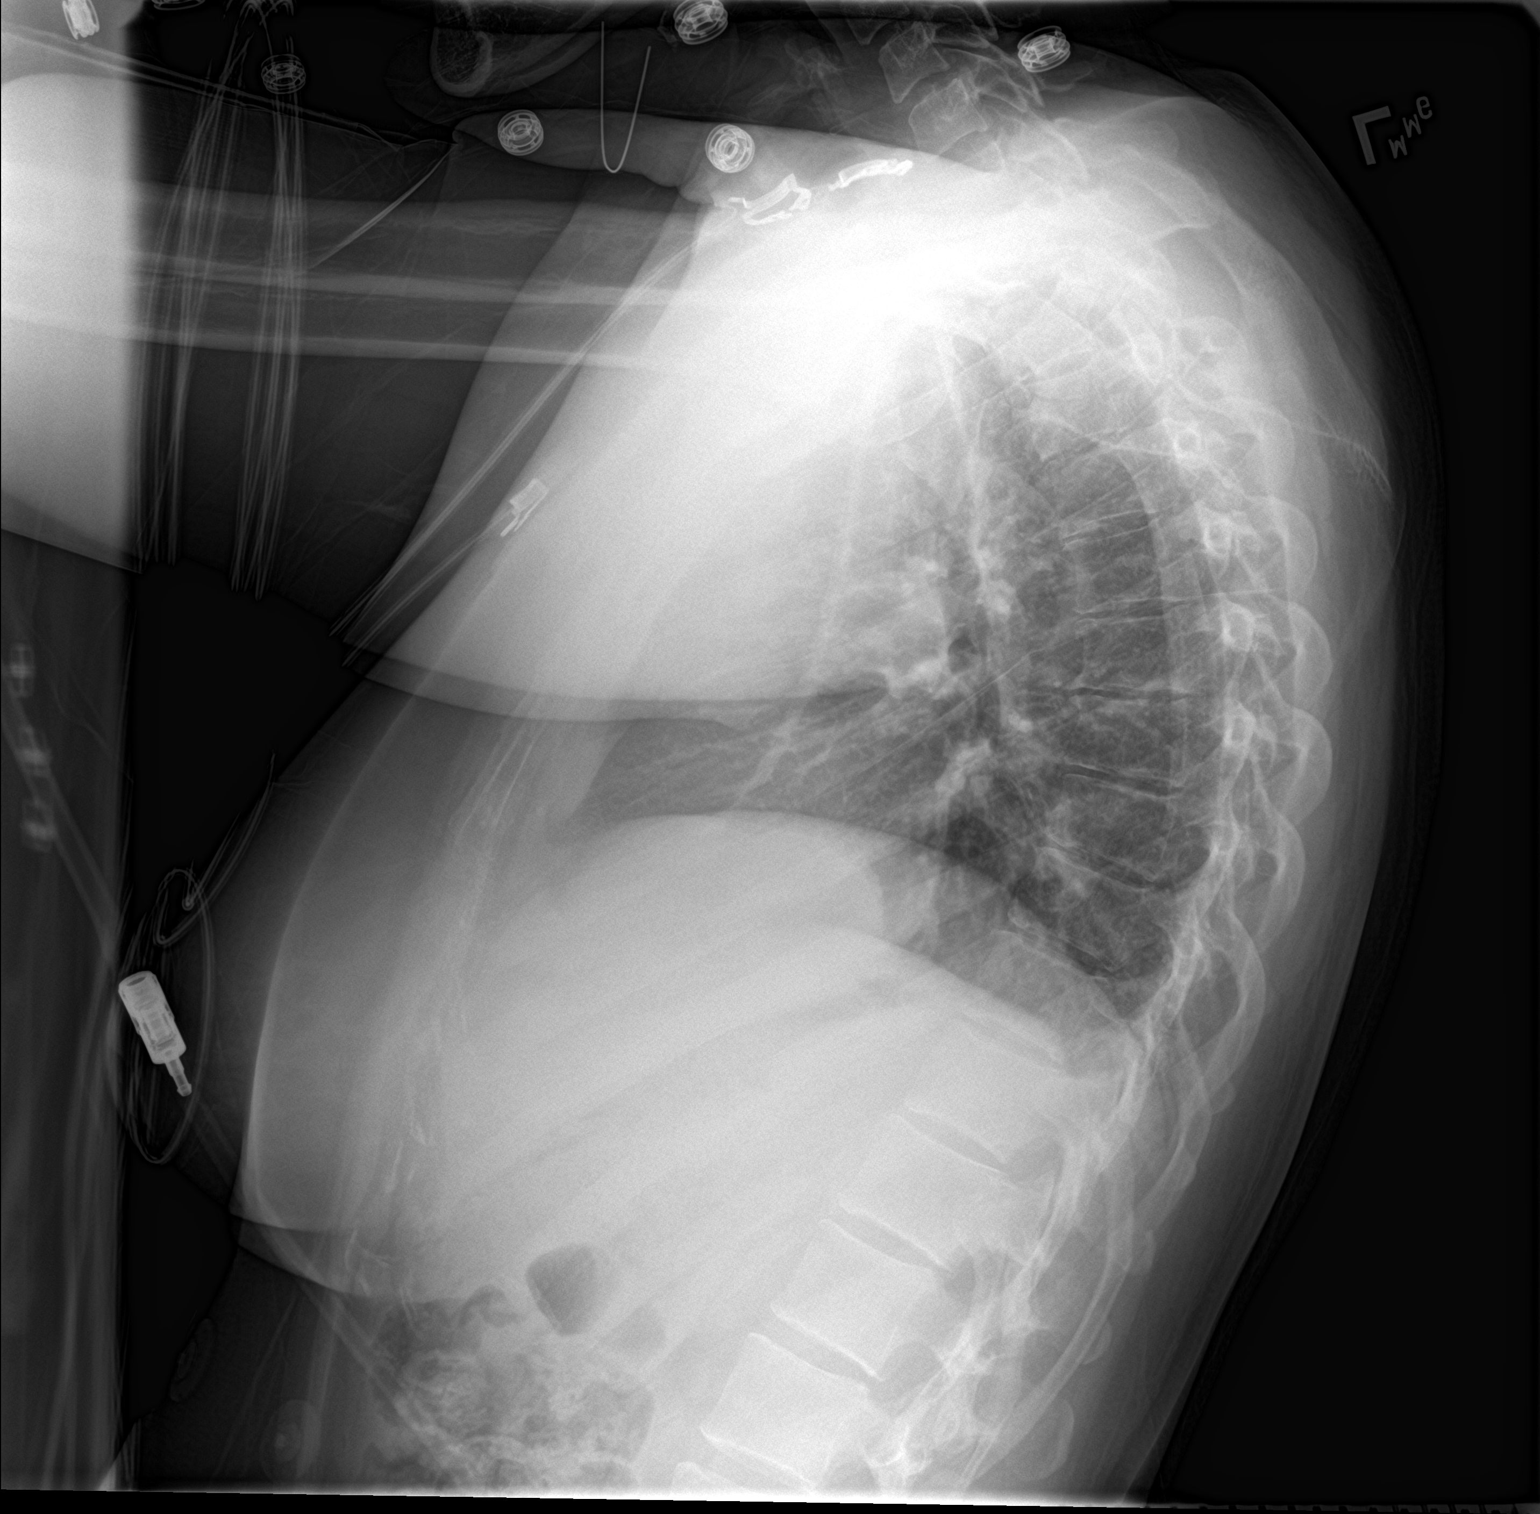

[2 of 2 positions shown; findings below may reference images not displayed]

FINDINGS: Power port type central venous catheter with tip over the cavoatrial
junction region. Normal heart size and pulmonary vascularity. No
focal airspace disease or consolidation in the lungs. No blunting of
costophrenic angles. No pneumothorax. Mediastinal contours appear
intact. Azygos lobe.
IMPRESSION: No active cardiopulmonary disease.

## 2018-02-26 ENCOUNTER — Encounter: Payer: Self-pay | Admitting: Family Medicine

## 2018-02-26 DIAGNOSIS — M9901 Segmental and somatic dysfunction of cervical region: Secondary | ICD-10-CM | POA: Diagnosis not present

## 2018-02-26 DIAGNOSIS — M9903 Segmental and somatic dysfunction of lumbar region: Secondary | ICD-10-CM | POA: Diagnosis not present

## 2018-03-04 DIAGNOSIS — F4320 Adjustment disorder, unspecified: Secondary | ICD-10-CM | POA: Diagnosis not present

## 2018-03-05 ENCOUNTER — Other Ambulatory Visit: Payer: Self-pay

## 2018-03-05 DIAGNOSIS — R197 Diarrhea, unspecified: Secondary | ICD-10-CM

## 2018-03-05 NOTE — Progress Notes (Signed)
[place]

## 2018-03-25 DIAGNOSIS — M9903 Segmental and somatic dysfunction of lumbar region: Secondary | ICD-10-CM | POA: Diagnosis not present

## 2018-03-25 DIAGNOSIS — M9901 Segmental and somatic dysfunction of cervical region: Secondary | ICD-10-CM | POA: Diagnosis not present

## 2018-03-27 ENCOUNTER — Ambulatory Visit: Payer: BLUE CROSS/BLUE SHIELD | Admitting: Family Medicine

## 2018-03-27 DIAGNOSIS — F4320 Adjustment disorder, unspecified: Secondary | ICD-10-CM | POA: Diagnosis not present

## 2018-04-08 IMAGING — CR DG CHEST 2V
2 series · 2 of 2 positions shown · non-contrast
Comparison: 10/28/2016

CLINICAL DATA: Fever history of lymphoma

EXAM:
CHEST  2 VIEW

[w chest pa]
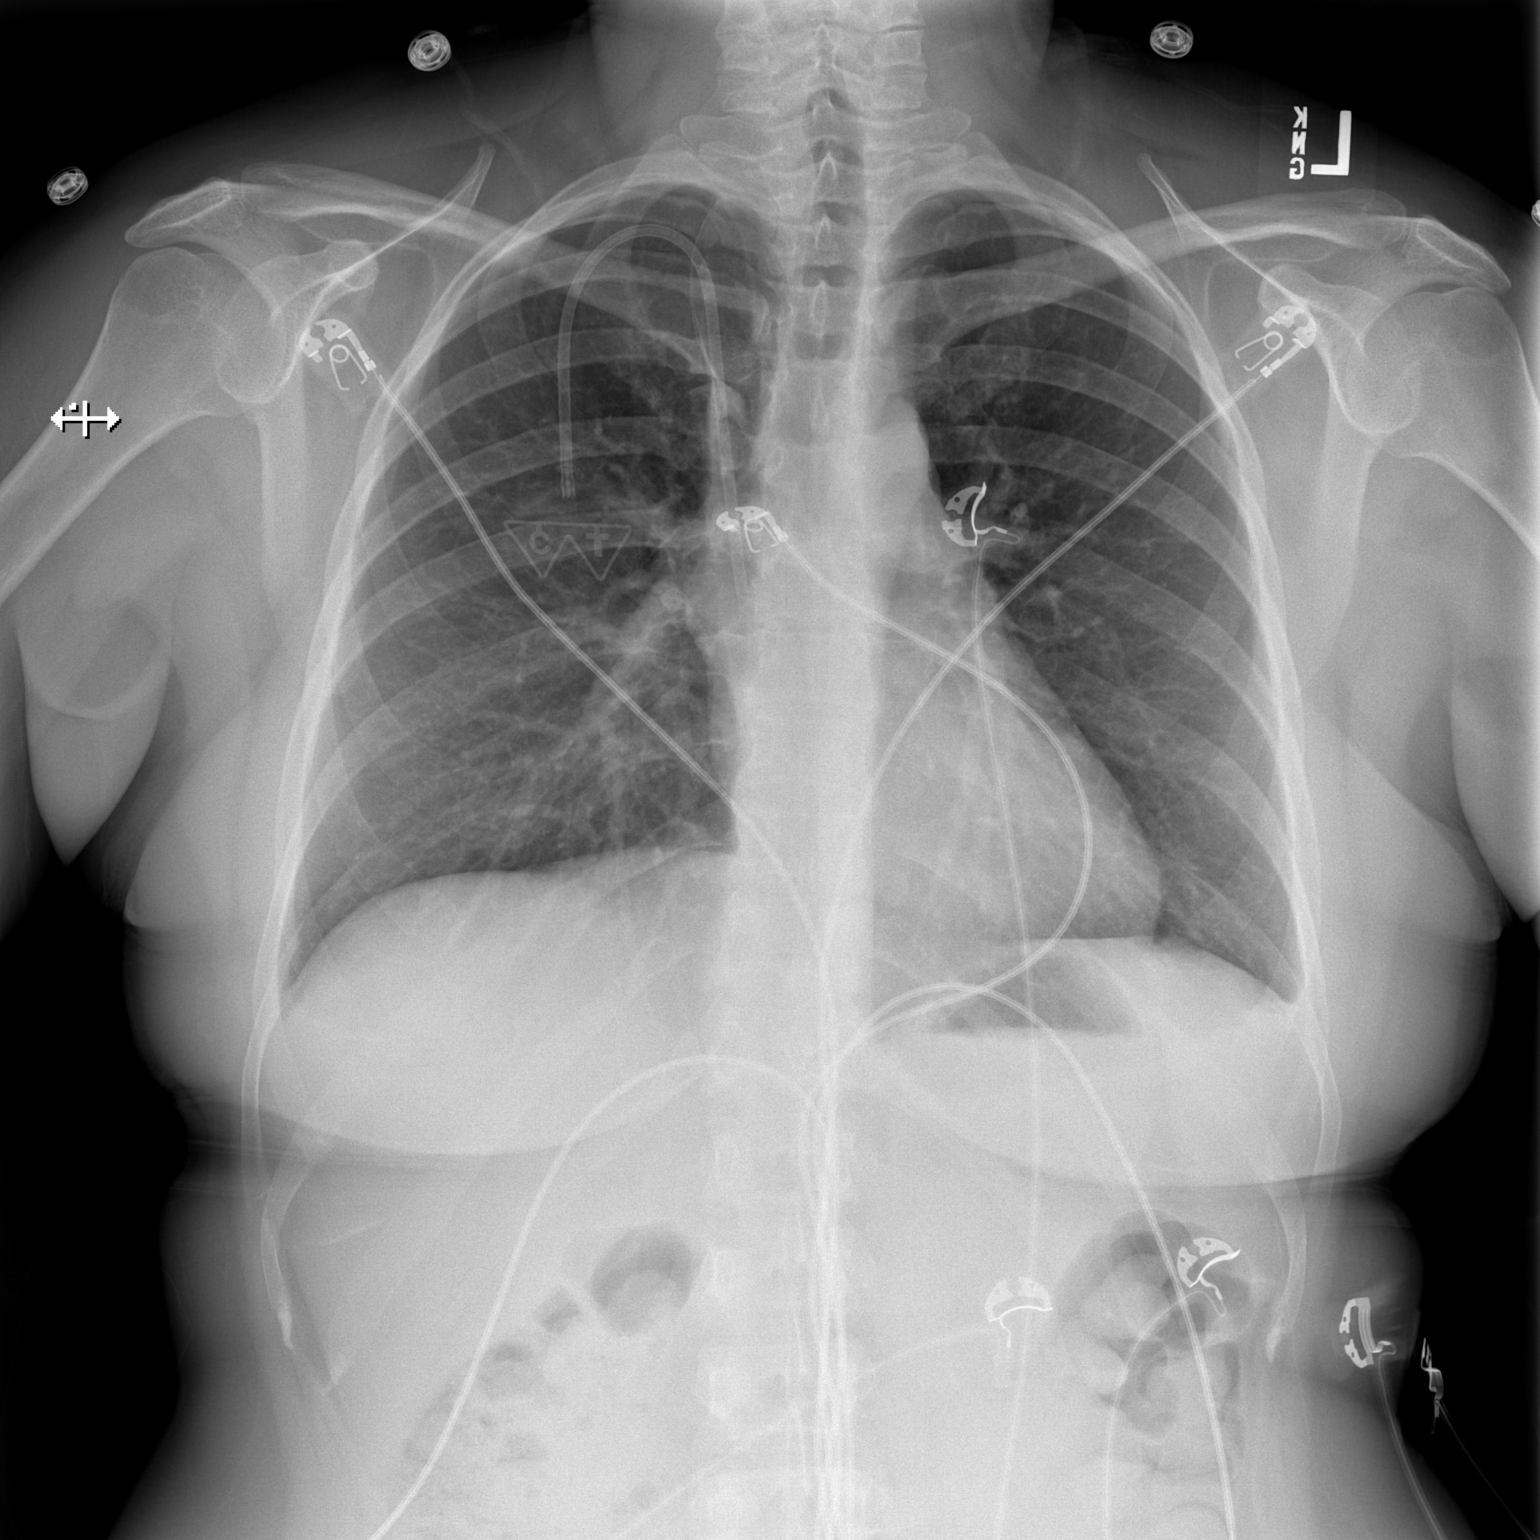

[w chest lat]
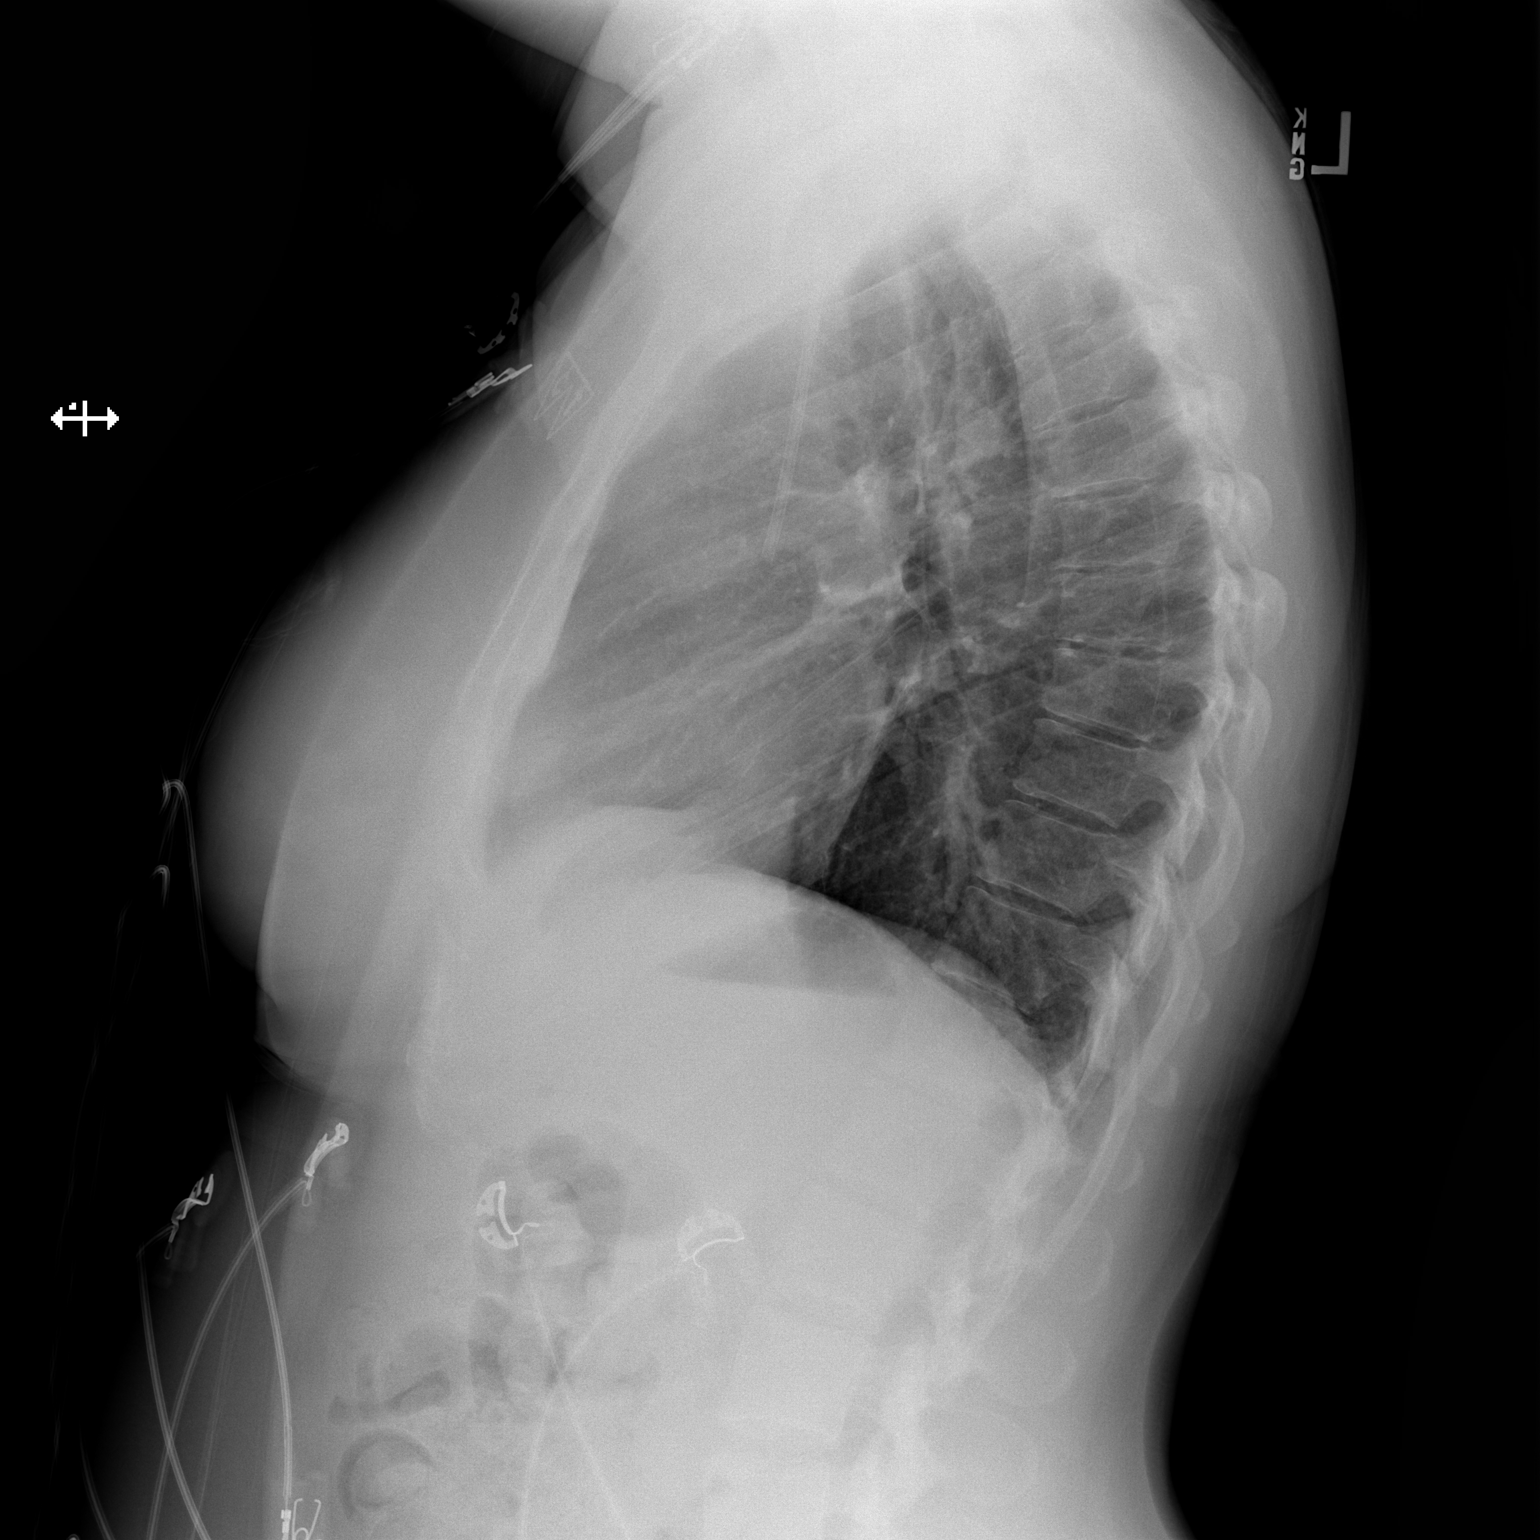

[2 of 2 positions shown; findings below may reference images not displayed]

FINDINGS: Right-sided central venous port tip overlies the mid SVC. Tiny left
effusion. No focal consolidation. Normal heart size. No
pneumothorax.
IMPRESSION: Tiny left effusion.  No acute infiltrate.

## 2018-04-09 DIAGNOSIS — F4320 Adjustment disorder, unspecified: Secondary | ICD-10-CM | POA: Diagnosis not present

## 2018-04-13 DIAGNOSIS — M9903 Segmental and somatic dysfunction of lumbar region: Secondary | ICD-10-CM | POA: Diagnosis not present

## 2018-04-13 DIAGNOSIS — M9901 Segmental and somatic dysfunction of cervical region: Secondary | ICD-10-CM | POA: Diagnosis not present

## 2018-04-27 DIAGNOSIS — C8332 Diffuse large B-cell lymphoma, intrathoracic lymph nodes: Secondary | ICD-10-CM | POA: Diagnosis not present

## 2018-04-27 DIAGNOSIS — R0602 Shortness of breath: Secondary | ICD-10-CM | POA: Diagnosis not present

## 2018-04-30 DIAGNOSIS — F4320 Adjustment disorder, unspecified: Secondary | ICD-10-CM | POA: Diagnosis not present

## 2018-05-07 DIAGNOSIS — M9901 Segmental and somatic dysfunction of cervical region: Secondary | ICD-10-CM | POA: Diagnosis not present

## 2018-05-07 DIAGNOSIS — M9903 Segmental and somatic dysfunction of lumbar region: Secondary | ICD-10-CM | POA: Diagnosis not present

## 2018-05-27 DIAGNOSIS — M9901 Segmental and somatic dysfunction of cervical region: Secondary | ICD-10-CM | POA: Diagnosis not present

## 2018-05-27 DIAGNOSIS — M9903 Segmental and somatic dysfunction of lumbar region: Secondary | ICD-10-CM | POA: Diagnosis not present

## 2018-05-28 DIAGNOSIS — F4320 Adjustment disorder, unspecified: Secondary | ICD-10-CM | POA: Diagnosis not present

## 2018-06-09 ENCOUNTER — Encounter: Payer: Self-pay | Admitting: Family Medicine

## 2018-06-17 DIAGNOSIS — M9901 Segmental and somatic dysfunction of cervical region: Secondary | ICD-10-CM | POA: Diagnosis not present

## 2018-06-17 DIAGNOSIS — M9903 Segmental and somatic dysfunction of lumbar region: Secondary | ICD-10-CM | POA: Diagnosis not present

## 2018-06-18 DIAGNOSIS — F4320 Adjustment disorder, unspecified: Secondary | ICD-10-CM | POA: Diagnosis not present

## 2018-07-14 DIAGNOSIS — M9901 Segmental and somatic dysfunction of cervical region: Secondary | ICD-10-CM | POA: Diagnosis not present

## 2018-07-14 DIAGNOSIS — M9903 Segmental and somatic dysfunction of lumbar region: Secondary | ICD-10-CM | POA: Diagnosis not present

## 2018-08-05 DIAGNOSIS — F4320 Adjustment disorder, unspecified: Secondary | ICD-10-CM | POA: Diagnosis not present

## 2018-08-11 DIAGNOSIS — M9901 Segmental and somatic dysfunction of cervical region: Secondary | ICD-10-CM | POA: Diagnosis not present

## 2018-08-11 DIAGNOSIS — M9903 Segmental and somatic dysfunction of lumbar region: Secondary | ICD-10-CM | POA: Diagnosis not present

## 2018-08-12 DIAGNOSIS — C8332 Diffuse large B-cell lymphoma, intrathoracic lymph nodes: Secondary | ICD-10-CM | POA: Diagnosis not present

## 2018-09-09 DIAGNOSIS — F4320 Adjustment disorder, unspecified: Secondary | ICD-10-CM | POA: Diagnosis not present

## 2018-09-14 DIAGNOSIS — M9901 Segmental and somatic dysfunction of cervical region: Secondary | ICD-10-CM | POA: Diagnosis not present

## 2018-09-14 DIAGNOSIS — M9903 Segmental and somatic dysfunction of lumbar region: Secondary | ICD-10-CM | POA: Diagnosis not present

## 2018-09-25 ENCOUNTER — Encounter: Payer: Self-pay | Admitting: Family Medicine

## 2018-09-30 DIAGNOSIS — F4320 Adjustment disorder, unspecified: Secondary | ICD-10-CM | POA: Diagnosis not present

## 2018-10-08 ENCOUNTER — Other Ambulatory Visit: Payer: Self-pay | Admitting: Family Medicine

## 2018-10-08 DIAGNOSIS — K582 Mixed irritable bowel syndrome: Secondary | ICD-10-CM

## 2018-10-29 DIAGNOSIS — F4320 Adjustment disorder, unspecified: Secondary | ICD-10-CM | POA: Diagnosis not present

## 2018-11-26 DIAGNOSIS — F4320 Adjustment disorder, unspecified: Secondary | ICD-10-CM | POA: Diagnosis not present

## 2018-12-15 DIAGNOSIS — M9901 Segmental and somatic dysfunction of cervical region: Secondary | ICD-10-CM | POA: Diagnosis not present

## 2018-12-15 DIAGNOSIS — M9903 Segmental and somatic dysfunction of lumbar region: Secondary | ICD-10-CM | POA: Diagnosis not present

## 2018-12-31 DIAGNOSIS — M9903 Segmental and somatic dysfunction of lumbar region: Secondary | ICD-10-CM | POA: Diagnosis not present

## 2018-12-31 DIAGNOSIS — M9901 Segmental and somatic dysfunction of cervical region: Secondary | ICD-10-CM | POA: Diagnosis not present

## 2019-01-28 DIAGNOSIS — M9901 Segmental and somatic dysfunction of cervical region: Secondary | ICD-10-CM | POA: Diagnosis not present

## 2019-01-28 DIAGNOSIS — M9903 Segmental and somatic dysfunction of lumbar region: Secondary | ICD-10-CM | POA: Diagnosis not present

## 2019-02-10 DIAGNOSIS — C8332 Diffuse large B-cell lymphoma, intrathoracic lymph nodes: Secondary | ICD-10-CM | POA: Diagnosis not present

## 2019-02-15 DIAGNOSIS — M9901 Segmental and somatic dysfunction of cervical region: Secondary | ICD-10-CM | POA: Diagnosis not present

## 2019-02-15 DIAGNOSIS — M9903 Segmental and somatic dysfunction of lumbar region: Secondary | ICD-10-CM | POA: Diagnosis not present

## 2019-03-16 DIAGNOSIS — M9901 Segmental and somatic dysfunction of cervical region: Secondary | ICD-10-CM | POA: Diagnosis not present

## 2019-03-16 DIAGNOSIS — M9903 Segmental and somatic dysfunction of lumbar region: Secondary | ICD-10-CM | POA: Diagnosis not present

## 2019-04-08 DIAGNOSIS — M9903 Segmental and somatic dysfunction of lumbar region: Secondary | ICD-10-CM | POA: Diagnosis not present

## 2019-04-08 DIAGNOSIS — M9901 Segmental and somatic dysfunction of cervical region: Secondary | ICD-10-CM | POA: Diagnosis not present

## 2019-05-03 DIAGNOSIS — M9901 Segmental and somatic dysfunction of cervical region: Secondary | ICD-10-CM | POA: Diagnosis not present

## 2019-05-03 DIAGNOSIS — M9903 Segmental and somatic dysfunction of lumbar region: Secondary | ICD-10-CM | POA: Diagnosis not present

## 2019-06-03 DIAGNOSIS — M9903 Segmental and somatic dysfunction of lumbar region: Secondary | ICD-10-CM | POA: Diagnosis not present

## 2019-06-03 DIAGNOSIS — M9901 Segmental and somatic dysfunction of cervical region: Secondary | ICD-10-CM | POA: Diagnosis not present

## 2019-07-01 DIAGNOSIS — M9903 Segmental and somatic dysfunction of lumbar region: Secondary | ICD-10-CM | POA: Diagnosis not present

## 2019-07-01 DIAGNOSIS — M9901 Segmental and somatic dysfunction of cervical region: Secondary | ICD-10-CM | POA: Diagnosis not present

## 2019-07-12 ENCOUNTER — Ambulatory Visit: Payer: BC Managed Care – PPO | Attending: Internal Medicine

## 2019-07-12 DIAGNOSIS — Z20828 Contact with and (suspected) exposure to other viral communicable diseases: Secondary | ICD-10-CM | POA: Diagnosis not present

## 2019-07-12 DIAGNOSIS — Z20822 Contact with and (suspected) exposure to covid-19: Secondary | ICD-10-CM

## 2019-07-14 LAB — NOVEL CORONAVIRUS, NAA: SARS-CoV-2, NAA: NOT DETECTED

## 2019-07-22 DIAGNOSIS — M9903 Segmental and somatic dysfunction of lumbar region: Secondary | ICD-10-CM | POA: Diagnosis not present

## 2019-07-22 DIAGNOSIS — M9901 Segmental and somatic dysfunction of cervical region: Secondary | ICD-10-CM | POA: Diagnosis not present

## 2019-08-12 DIAGNOSIS — F4320 Adjustment disorder, unspecified: Secondary | ICD-10-CM | POA: Diagnosis not present

## 2019-08-16 DIAGNOSIS — C8332 Diffuse large B-cell lymphoma, intrathoracic lymph nodes: Secondary | ICD-10-CM | POA: Diagnosis not present

## 2019-08-19 DIAGNOSIS — M9903 Segmental and somatic dysfunction of lumbar region: Secondary | ICD-10-CM | POA: Diagnosis not present

## 2019-08-19 DIAGNOSIS — M9901 Segmental and somatic dysfunction of cervical region: Secondary | ICD-10-CM | POA: Diagnosis not present

## 2019-08-25 DIAGNOSIS — C8332 Diffuse large B-cell lymphoma, intrathoracic lymph nodes: Secondary | ICD-10-CM | POA: Diagnosis not present

## 2019-08-26 DIAGNOSIS — F4320 Adjustment disorder, unspecified: Secondary | ICD-10-CM | POA: Diagnosis not present

## 2019-09-09 DIAGNOSIS — F4320 Adjustment disorder, unspecified: Secondary | ICD-10-CM | POA: Diagnosis not present

## 2019-09-16 DIAGNOSIS — M9903 Segmental and somatic dysfunction of lumbar region: Secondary | ICD-10-CM | POA: Diagnosis not present

## 2019-09-16 DIAGNOSIS — M9901 Segmental and somatic dysfunction of cervical region: Secondary | ICD-10-CM | POA: Diagnosis not present

## 2019-09-23 DIAGNOSIS — F4320 Adjustment disorder, unspecified: Secondary | ICD-10-CM | POA: Diagnosis not present

## 2019-10-20 DIAGNOSIS — F4320 Adjustment disorder, unspecified: Secondary | ICD-10-CM | POA: Diagnosis not present

## 2019-10-26 DIAGNOSIS — M9903 Segmental and somatic dysfunction of lumbar region: Secondary | ICD-10-CM | POA: Diagnosis not present

## 2019-10-26 DIAGNOSIS — M9901 Segmental and somatic dysfunction of cervical region: Secondary | ICD-10-CM | POA: Diagnosis not present

## 2019-11-11 DIAGNOSIS — F4320 Adjustment disorder, unspecified: Secondary | ICD-10-CM | POA: Diagnosis not present

## 2019-11-18 DIAGNOSIS — M9901 Segmental and somatic dysfunction of cervical region: Secondary | ICD-10-CM | POA: Diagnosis not present

## 2019-11-18 DIAGNOSIS — M9903 Segmental and somatic dysfunction of lumbar region: Secondary | ICD-10-CM | POA: Diagnosis not present

## 2019-12-02 DIAGNOSIS — F4320 Adjustment disorder, unspecified: Secondary | ICD-10-CM | POA: Diagnosis not present

## 2019-12-09 DIAGNOSIS — M9901 Segmental and somatic dysfunction of cervical region: Secondary | ICD-10-CM | POA: Diagnosis not present

## 2019-12-09 DIAGNOSIS — M9903 Segmental and somatic dysfunction of lumbar region: Secondary | ICD-10-CM | POA: Diagnosis not present

## 2019-12-30 DIAGNOSIS — M9901 Segmental and somatic dysfunction of cervical region: Secondary | ICD-10-CM | POA: Diagnosis not present

## 2020-01-06 DIAGNOSIS — F4320 Adjustment disorder, unspecified: Secondary | ICD-10-CM | POA: Diagnosis not present

## 2020-01-31 DIAGNOSIS — M9901 Segmental and somatic dysfunction of cervical region: Secondary | ICD-10-CM | POA: Diagnosis not present

## 2020-02-03 DIAGNOSIS — F4320 Adjustment disorder, unspecified: Secondary | ICD-10-CM | POA: Diagnosis not present

## 2020-02-07 DIAGNOSIS — C8332 Diffuse large B-cell lymphoma, intrathoracic lymph nodes: Secondary | ICD-10-CM | POA: Diagnosis not present

## 2020-02-21 DIAGNOSIS — M9901 Segmental and somatic dysfunction of cervical region: Secondary | ICD-10-CM | POA: Diagnosis not present

## 2020-03-13 DIAGNOSIS — M9901 Segmental and somatic dysfunction of cervical region: Secondary | ICD-10-CM | POA: Diagnosis not present

## 2020-03-16 DIAGNOSIS — F4320 Adjustment disorder, unspecified: Secondary | ICD-10-CM | POA: Diagnosis not present

## 2020-04-20 DIAGNOSIS — F4320 Adjustment disorder, unspecified: Secondary | ICD-10-CM | POA: Diagnosis not present

## 2020-04-27 DIAGNOSIS — M9901 Segmental and somatic dysfunction of cervical region: Secondary | ICD-10-CM | POA: Diagnosis not present

## 2020-05-16 DIAGNOSIS — M9901 Segmental and somatic dysfunction of cervical region: Secondary | ICD-10-CM | POA: Diagnosis not present

## 2020-05-30 DIAGNOSIS — M9901 Segmental and somatic dysfunction of cervical region: Secondary | ICD-10-CM | POA: Diagnosis not present

## 2020-06-02 ENCOUNTER — Encounter: Payer: BC Managed Care – PPO | Admitting: Physician Assistant

## 2020-06-15 DIAGNOSIS — M9901 Segmental and somatic dysfunction of cervical region: Secondary | ICD-10-CM | POA: Diagnosis not present

## 2020-06-20 DIAGNOSIS — F4320 Adjustment disorder, unspecified: Secondary | ICD-10-CM | POA: Diagnosis not present

## 2020-07-03 DIAGNOSIS — M9901 Segmental and somatic dysfunction of cervical region: Secondary | ICD-10-CM | POA: Diagnosis not present

## 2020-07-19 ENCOUNTER — Encounter: Payer: Self-pay | Admitting: Physician Assistant

## 2020-07-19 ENCOUNTER — Ambulatory Visit (INDEPENDENT_AMBULATORY_CARE_PROVIDER_SITE_OTHER): Payer: BC Managed Care – PPO | Admitting: Physician Assistant

## 2020-07-19 ENCOUNTER — Other Ambulatory Visit: Payer: Self-pay

## 2020-07-19 VITALS — BP 100/70 | HR 73 | Temp 98.0°F | Ht 63.0 in | Wt 187.0 lb

## 2020-07-19 DIAGNOSIS — Z23 Encounter for immunization: Secondary | ICD-10-CM

## 2020-07-19 DIAGNOSIS — Z1159 Encounter for screening for other viral diseases: Secondary | ICD-10-CM

## 2020-07-19 DIAGNOSIS — E041 Nontoxic single thyroid nodule: Secondary | ICD-10-CM | POA: Diagnosis not present

## 2020-07-19 DIAGNOSIS — Z0000 Encounter for general adult medical examination without abnormal findings: Secondary | ICD-10-CM

## 2020-07-19 DIAGNOSIS — Z1283 Encounter for screening for malignant neoplasm of skin: Secondary | ICD-10-CM

## 2020-07-19 DIAGNOSIS — J4520 Mild intermittent asthma, uncomplicated: Secondary | ICD-10-CM | POA: Diagnosis not present

## 2020-07-19 DIAGNOSIS — J45909 Unspecified asthma, uncomplicated: Secondary | ICD-10-CM | POA: Insufficient documentation

## 2020-07-19 DIAGNOSIS — E669 Obesity, unspecified: Secondary | ICD-10-CM

## 2020-07-19 DIAGNOSIS — Z13220 Encounter for screening for lipoid disorders: Secondary | ICD-10-CM

## 2020-07-19 DIAGNOSIS — Z136 Encounter for screening for cardiovascular disorders: Secondary | ICD-10-CM | POA: Diagnosis not present

## 2020-07-19 MED ORDER — ALBUTEROL SULFATE HFA 108 (90 BASE) MCG/ACT IN AERS: 2.0000 | INHALATION_SPRAY | Freq: Four times a day (QID) | RESPIRATORY_TRACT | 2 refills | Status: AC | PRN

## 2020-07-19 NOTE — Patient Instructions (Signed)
It was great to see you!  Follow-up this fall for a PAP.  Referral for dermatology has been put in, you should hear something about this appointment in a few weeks or so.  Please go to the lab for blood work.   Our office will call you with your results unless you have chosen to receive results via MyChart.  If your blood work is normal we will follow-up each year for physicals and as scheduled for chronic medical problems.  If anything is abnormal we will treat accordingly and get you in for a follow-up.  Take care,  [facility] Maintenance, Female Adopting a healthy lifestyle and getting preventive care are important in promoting health and wellness. Ask your health care provider about:  The right schedule for you to have regular tests and exams.  Things you can do on your own to prevent diseases and keep yourself healthy. What should I know about diet, weight, and exercise? Eat a healthy diet   Eat a diet that includes plenty of vegetables, fruits, low-fat dairy products, and lean protein.  Do not eat a lot of foods that are high in solid fats, added sugars, or sodium. Maintain a healthy weight Body mass index (BMI) is used to identify weight problems. It estimates body fat based on height and weight. Your health care provider can help determine your BMI and help you achieve or maintain a healthy weight. Get regular exercise Get regular exercise. This is one of the most important things you can do for your health. Most adults should:  Exercise for at least 150 minutes each week. The exercise should increase your heart rate and make you sweat (moderate-intensity exercise).  Do strengthening exercises at least twice a week. This is in addition to the moderate-intensity exercise.  Spend less time sitting. Even light physical activity can be beneficial. Watch cholesterol and blood lipids Have your blood tested for lipids and cholesterol at 37 years of age, then have  this test every 5 years. Have your cholesterol levels checked more often if:  Your lipid or cholesterol levels are high.  You are older than 37 years of age.  You are at high risk for heart disease. What should I know about cancer screening? Depending on your health history and family history, you may need to have cancer screening at various ages. This may include screening for:  Breast cancer.  Cervical cancer.  Colorectal cancer.  Skin cancer.  Lung cancer. What should I know about heart disease, diabetes, and high blood pressure? Blood pressure and heart disease  High blood pressure causes heart disease and increases the risk of stroke. This is more likely to develop in people who have high blood pressure readings, are of African descent, or are overweight.  Have your blood pressure checked: ? Every 3-5 years if you are 29-37 years of age. ? Every year if you are 110 years old or older. Diabetes Have regular diabetes screenings. This checks your fasting blood sugar level. Have the screening done:  Once every three years after age 45 if you are at a normal weight and have a low risk for diabetes.  More often and at a younger age if you are overweight or have a high risk for diabetes. What should I know about preventing infection? Hepatitis B If you have a higher risk for hepatitis B, you should be screened for this virus. Talk with your health care provider to find out if you are at  risk for hepatitis B infection. Hepatitis C Testing is recommended for:  Everyone born from [birth year] through 1967.  Anyone with known risk factors for hepatitis C. Sexually transmitted infections (STIs)  Get screened for STIs, including gonorrhea and chlamydia, if: ? You are sexually active and are younger than 37 years of age. ? You are older than 37 years of age and your health care provider tells you that you are at risk for this type of infection. ? Your sexual activity has changed since  you were last screened, and you are at increased risk for chlamydia or gonorrhea. Ask your health care provider if you are at risk.  Ask your health care provider about whether you are at high risk for HIV. Your health care provider may recommend a prescription medicine to help prevent HIV infection. If you choose to take medicine to prevent HIV, you should first get tested for HIV. You should then be tested every 3 months for as long as you are taking the medicine. Pregnancy  If you are about to stop having your period (premenopausal) and you may become pregnant, seek counseling before you get pregnant.  Take 400 to 800 micrograms (mcg) of folic acid every day if you become pregnant.  Ask for birth control (contraception) if you want to prevent pregnancy. Osteoporosis and menopause Osteoporosis is a disease in which the bones lose minerals and strength with aging. This can result in bone fractures. If you are 5 years old or older, or if you are at risk for osteoporosis and fractures, ask your health care provider if you should:  Be screened for bone loss.  Take a calcium or vitamin D supplement to lower your risk of fractures.  Be given hormone replacement therapy (HRT) to treat symptoms of menopause. Follow these instructions at home: Lifestyle  Do not use any products that contain nicotine or tobacco, such as cigarettes, e-cigarettes, and chewing tobacco. If you need help quitting, ask your health care provider.  Do not use street drugs.  Do not share needles.  Ask your health care provider for help if you need support or information about quitting drugs. Alcohol use  Do not drink alcohol if: ? Your health care provider tells you not to drink. ? You are pregnant, may be pregnant, or are planning to become pregnant.  If you drink alcohol: ? Limit how much you use to 0-1 drink a day. ? Limit intake if you are breastfeeding.  Be aware of how much alcohol is in your drink. In  the [place] one drink equals one 12 oz bottle of beer (355 mL), one 5 oz glass of wine (148 mL), or one 1 oz glass of hard liquor (44 mL). General instructions  Schedule regular health, dental, and eye exams.  Stay current with your vaccines.  Tell your health care provider if: ? You often feel depressed. ? You have ever been abused or do not feel safe at home. Summary  Adopting a healthy lifestyle and getting preventive care are important in promoting health and wellness.  Follow your health care provider's instructions about healthy diet, exercising, and getting tested or screened for diseases.  Follow your health care provider's instructions on monitoring your cholesterol and blood pressure. This information is not intended to replace advice given to you by your health care provider. Make sure you discuss any questions you have with your health care provider. Document Revised: 06/24/2018 Document Reviewed: 06/24/2018 Elsevier Patient Education  2020 [employer].

## 2020-07-19 NOTE — Progress Notes (Signed)
I acted as a Education administrator for [employer], PA-C Anselmo Pickler, LPN   Subjective:    Michelle Proctor is a 35 y.o. female and is here for a comprehensive physical exam.  HPI  Health Maintenance Due  Topic Date Due  . Hepatitis C Screening  Never done    Acute Concerns: None  Chronic Issues: Asthma -- exercise-induced and seasonal. Has been on maintenance inhaler in the past, but not recently. Does not use albuterol inhaler regularly at this time. Thyroid nodule -- history of this. Was getting yearly u/s 2014-2016. Now gets yearly petscans with her oncologist due to history of B-cell lymphoma.  Health Maintenance: Immunizations -- UTD, will give Tdap today Colonoscopy -- N/A Mammogram -- N/A PAP -- UTD, due 01/2021 Bone Density -- N/A Diet -- feels like she eats more sugar/carbs; not significant sodas and juices Caffeine intake -- coffee daily Sleep habits -- gets good sleep Exercise -- stationary bike; walks; active job as Pharmacist, hospital Current Weight -- Weight: 187 lb (84.8 kg)  Weight History: Wt Readings from Last 10 Encounters:  07/19/20 187 lb (84.8 kg)  02/10/18 182 lb 9.6 oz (82.8 kg)  02/02/18 180 lb 12.8 oz (82 kg)  12/12/16 174 lb (78.9 kg)  10/28/16 174 lb (78.9 kg)   Body mass index is 33.13 kg/m. Mood -- no concerns Patient's last menstrual period was 06/23/2020. Period characteristics -- regular, no concerns Birth control -- no OCP  Depression screen PHQ 2/9 07/19/2020  Decreased Interest 0  Down, Depressed, Hopeless 0  PHQ - 2 Score 0   UTD with dentist and eye doctor  Other providers/specialists: Patient Care Team: Inda Coke, [place] as PCP - General (Physician Assistant) Sunny Schlein, MD as Referring Physician (Hematology)   PMHx, SurgHx, SocialHx, Medications, and Allergies were reviewed in the Visit Navigator and updated as appropriate.   Past Medical History:  Diagnosis Date  . Anemia associated with chemotherapy 10/29/2016  . Asthma   .  Chicken pox   . DLBCL (diffuse large B cell lymphoma) ([place]) 10/29/2016  . History of blood transfusion   . Pneumothorax on left 10/05/2016    History reviewed. No pertinent surgical history.   Family History  Problem Relation Age of Onset  . Arthritis Mother   . Thyroid disease Mother   . Thyroid disease Father   . Heart disease Father        heart valve replacement and bypasses  . Arthritis Maternal Grandmother   . Heart attack Maternal Grandfather   . Hyperlipidemia Maternal Grandfather   . Heart attack Paternal Grandfather     Social History   Tobacco Use  . Smoking status: Never Smoker  . Smokeless tobacco: Never Used  Substance Use Topics  . Alcohol use: Yes    Comment: ocassionally  . Drug use: No    Review of Systems:   Review of Systems  Constitutional: Negative for chills, fever, malaise/fatigue and weight loss.  HENT: Negative for hearing loss, sinus pain and sore throat.   Eyes: Negative for blurred vision.  Respiratory: Negative for cough and shortness of breath.   Cardiovascular: Negative for chest pain, palpitations and leg swelling.  Gastrointestinal: Negative for abdominal pain, constipation, diarrhea, heartburn, nausea and vomiting.  Genitourinary: Negative for dysuria, frequency and urgency.  Musculoskeletal: Negative for back pain, myalgias and neck pain.  Skin: Negative for itching and rash.  Neurological: Negative for dizziness, tingling, seizures, loss of consciousness and headaches.  Endo/Heme/Allergies: Negative for polydipsia.  Psychiatric/Behavioral: Negative  for depression. The patient is not nervous/anxious.   All other systems reviewed and are negative.   Objective:   BP 100/70 (BP Location: Left Arm, Patient Position: Sitting, Cuff Size: Normal)   Pulse 73   Temp 98 F (36.7 C) (Temporal)   Ht 5\' 3"  (1.6 m)   Wt 187 lb (84.8 kg)   LMP 06/23/2020   SpO2 98%   BMI 33.13 kg/m   General Appearance:    Alert, cooperative, no  distress, appears stated age  Head:    Normocephalic, without obvious abnormality, atraumatic  Eyes:    PERRL, conjunctiva/corneas clear, EOM's intact, fundi    benign, both eyes  Ears:    Normal TM's and external ear canals, both ears  Nose:   Nares normal, septum midline, mucosa normal, no drainage    or sinus tenderness  Throat:   Lips, mucosa, and tongue normal; teeth and gums normal  Neck:   Supple, symmetrical, trachea midline, no adenopathy;    thyroid:  no enlargement/tenderness/nodules; no carotid   bruit or JVD  Back:     Symmetric, no curvature, ROM normal, no CVA tenderness  Lungs:     Clear to auscultation bilaterally, respirations unlabored  Chest Wall:    No tenderness or deformity   Heart:    Regular rate and rhythm, S1 and S2 normal, no murmur, rub   or gallop  Breast Exam:    Deferred  Abdomen:     Soft, non-tender, bowel sounds active all four quadrants,    no masses, no organomegaly  Genitalia:    Deferred  Rectal:    Deferred  Extremities:   Extremities normal, atraumatic, no cyanosis or edema  Pulses:   2+ and symmetric all extremities  Skin:   Skin color, texture, turgor normal, no rashes or lesions  Lymph nodes:   Cervical, supraclavicular, and axillary nodes normal  Neurologic:   CNII-XII intact, normal strength, sensation and reflexes    throughout     Assessment/Plan:   Michelle Proctor was seen today for transfer of care.  Diagnoses and all orders for this visit:  Routine physical examination Today patient counseled on age appropriate routine health concerns for screening and prevention, each reviewed and up to date or declined. Immunizations reviewed and up to date or declined. Labs ordered and reviewed. Risk factors for depression reviewed and negative. Hearing function and visual acuity are intact. ADLs screened and addressed as needed. Functional ability and level of safety reviewed and appropriate. Education, counseling and referrals performed based on  assessed risks today. Patient provided with a copy of personalized plan for preventive services. -     CBC with Differential/Platelet -     Comprehensive metabolic panel  Mild intermittent asthma, unspecified whether complicated Refill albuterol. Follow-up as needed. -     albuterol (VENTOLIN HFA) 108 (90 Base) MCG/ACT inhaler; Inhale 2 puffs into the lungs every 6 (six) hours as needed for wheezing or shortness of breath.  Encounter for lipid screening for cardiovascular disease -     Lipid panel  Encounter for screening for other viral diseases -     Hepatitis C antibody  Need for skin cancer screening She would like skin cancer screening. Derm referral placed.  Thyroid nodule Update TSH today. PET scan last year by oncology evaluated thyroid, will defer imaging to them. -     TSH  Obesity Work on healthy eating and exercise as able.  Need for prophylactic vaccination with combined diphtheria-tetanus-pertussis (DTP) vaccine -  Tdap vaccine greater than or equal to 7yo IM    Well Adult Exam: Labs ordered: Yes. Patient counseling was done. See below for items discussed. Discussed the patient's BMI. The BMI is not in the acceptable range; BMI management plan is completed Follow up in one year.  Patient Counseling:   [x]     Nutrition: Stressed importance of moderation in sodium/caffeine intake, saturated fat and cholesterol, caloric balance, sufficient intake of fresh fruits, vegetables, fiber, calcium, iron, and 1 mg of folate supplement per day (for females capable of pregnancy).   [x]      Stressed the importance of regular exercise.    [x]     Substance Abuse: Discussed cessation/primary prevention of tobacco, alcohol, or other drug use; driving or other dangerous activities under the influence; availability of treatment for abuse.    [x]      Injury prevention: Discussed safety belts, safety helmets, smoke detector, smoking near bedding or upholstery.    [x]       Sexuality: Discussed sexually transmitted diseases, partner selection, use of condoms, avoidance of unintended pregnancy  and contraceptive alternatives.    [x]     Dental health: Discussed importance of regular tooth brushing, flossing, and dental visits.   [x]      Health maintenance and immunizations reviewed. Please refer to Health maintenance section.   CMA or LPN served as scribe during this visit. History, Physical, and Plan performed by medical provider. The above documentation has been reviewed and is accurate and complete.  Inda Coke, PA-C [place]

## 2020-07-20 DIAGNOSIS — F4320 Adjustment disorder, unspecified: Secondary | ICD-10-CM | POA: Diagnosis not present

## 2020-07-20 LAB — COMPREHENSIVE METABOLIC PANEL
ALT: 13 U/L (ref 0–35)
AST: 15 U/L (ref 0–37)
Albumin: 4.3 g/dL (ref 3.5–5.2)
Alkaline Phosphatase: 72 U/L (ref 39–117)
BUN: 12 mg/dL (ref 6–23)
CO2: 28 mEq/L (ref 19–32)
Calcium: 9 mg/dL (ref 8.4–10.5)
Chloride: 103 mEq/L (ref 96–112)
Creatinine, Ser: 0.72 mg/dL (ref 0.40–1.20)
GFR: 107.71 mL/min (ref >60.00)
Glucose, Bld: 105 mg/dL — ABNORMAL HIGH (ref 70–99)
Potassium: 4 mEq/L (ref 3.5–5.1)
Sodium: 138 mEq/L (ref 135–145)
Total Bilirubin: 0.2 mg/dL (ref 0.2–1.2)
Total Protein: 6.9 g/dL (ref 6.0–8.3)

## 2020-07-20 LAB — CBC WITH DIFFERENTIAL/PLATELET
Basophils Absolute: 0.1 10*3/uL (ref 0.0–0.1)
Basophils Relative: 1 % (ref 0.0–3.0)
Eosinophils Absolute: 0.2 10*3/uL (ref 0.0–0.7)
Eosinophils Relative: 2.9 % (ref 0.0–5.0)
HCT: 40.6 % (ref 36.0–46.0)
Hemoglobin: 13.8 g/dL (ref 12.0–15.0)
Lymphocytes Relative: 26 % (ref 12.0–46.0)
Lymphs Abs: 2 10*3/uL (ref 0.7–4.0)
MCHC: 34 g/dL (ref 30.0–36.0)
MCV: 84.4 fl (ref 78.0–100.0)
Monocytes Absolute: 0.4 10*3/uL (ref 0.1–1.0)
Monocytes Relative: 5.8 % (ref 3.0–12.0)
Neutro Abs: 4.8 10*3/uL (ref 1.4–7.7)
Neutrophils Relative %: 64.3 % (ref 43.0–77.0)
Platelets: 234 10*3/uL (ref 150.0–400.0)
RBC: 4.81 Mil/uL (ref 3.87–5.11)
RDW: 13.3 % (ref 11.5–15.5)
WBC: 7.5 10*3/uL (ref 4.0–10.5)

## 2020-07-20 LAB — LIPID PANEL
Cholesterol: 171 mg/dL (ref 0–200)
HDL: 46 mg/dL (ref >39.00)
LDL Cholesterol: 105 mg/dL — ABNORMAL HIGH (ref 0–99)
NonHDL: 124.8
Total CHOL/HDL Ratio: 4
Triglycerides: 100 mg/dL (ref 0.0–149.0)
VLDL: 20 mg/dL (ref 0.0–40.0)

## 2020-07-20 LAB — HEPATITIS C ANTIBODY
Hepatitis C Ab: NONREACTIVE
SIGNAL TO CUT-OFF: 0.01 (ref <1.00)

## 2020-07-20 LAB — TSH: TSH: 1.32 u[IU]/mL (ref 0.35–4.50)

## 2020-07-24 ENCOUNTER — Telehealth: Payer: Self-pay | Admitting: Physician Assistant

## 2020-07-24 DIAGNOSIS — M9901 Segmental and somatic dysfunction of cervical region: Secondary | ICD-10-CM | POA: Diagnosis not present

## 2020-07-24 NOTE — Telephone Encounter (Signed)
Patient is calling for a referral appointment from Inda Coke, PA's office.  Patient is scheduled for 01/02/2021 at 10:30 with [facility], PA-C.

## 2020-08-07 DIAGNOSIS — C8332 Diffuse large B-cell lymphoma, intrathoracic lymph nodes: Secondary | ICD-10-CM | POA: Diagnosis not present

## 2020-08-07 DIAGNOSIS — Z23 Encounter for immunization: Secondary | ICD-10-CM | POA: Diagnosis not present

## 2020-08-15 DIAGNOSIS — M9901 Segmental and somatic dysfunction of cervical region: Secondary | ICD-10-CM | POA: Diagnosis not present

## 2020-08-17 DIAGNOSIS — F4320 Adjustment disorder, unspecified: Secondary | ICD-10-CM | POA: Diagnosis not present

## 2020-09-05 DIAGNOSIS — M9901 Segmental and somatic dysfunction of cervical region: Secondary | ICD-10-CM | POA: Diagnosis not present

## 2020-09-14 DIAGNOSIS — F4320 Adjustment disorder, unspecified: Secondary | ICD-10-CM | POA: Diagnosis not present

## 2020-09-26 DIAGNOSIS — M9901 Segmental and somatic dysfunction of cervical region: Secondary | ICD-10-CM | POA: Diagnosis not present

## 2020-10-12 DIAGNOSIS — F4320 Adjustment disorder, unspecified: Secondary | ICD-10-CM | POA: Diagnosis not present

## 2020-10-19 DIAGNOSIS — M9901 Segmental and somatic dysfunction of cervical region: Secondary | ICD-10-CM | POA: Diagnosis not present

## 2020-11-06 DIAGNOSIS — M9901 Segmental and somatic dysfunction of cervical region: Secondary | ICD-10-CM | POA: Diagnosis not present

## 2020-11-22 ENCOUNTER — Other Ambulatory Visit: Payer: Self-pay

## 2020-11-22 ENCOUNTER — Telehealth (INDEPENDENT_AMBULATORY_CARE_PROVIDER_SITE_OTHER): Payer: BC Managed Care – PPO | Admitting: Family Medicine

## 2020-11-22 DIAGNOSIS — R059 Cough, unspecified: Secondary | ICD-10-CM

## 2020-11-22 DIAGNOSIS — Z8572 Personal history of non-Hodgkin lymphomas: Secondary | ICD-10-CM

## 2020-11-22 MED ORDER — HYDROCODONE BIT-HOMATROP MBR 5-1.5 MG/5ML PO SOLN: 5.0000 mL | Freq: Three times a day (TID) | ORAL | 0 refills | Status: AC | PRN

## 2020-11-22 MED ORDER — BENZONATATE 100 MG PO CAPS: 100.0000 mg | ORAL_CAPSULE | Freq: Three times a day (TID) | ORAL | 0 refills | Status: AC | PRN

## 2020-11-22 NOTE — Progress Notes (Signed)
Patient ID: Michelle Proctor, female   DOB: 10/25/1985, 35 y.o.   MRN: [id number]   This visit type was conducted due to national recommendations for restrictions regarding the COVID-19 pandemic in an effort to limit this patient's exposure and mitigate transmission in our community.   Virtual Visit via Video Note  I connected with Michelle Proctor on 11/22/20 at 10:00 AM EDT by a video enabled telemedicine application and verified that I am speaking with the correct person using two identifiers.  Location patient: home Location provider:work or home office Persons participating in the virtual visit: patient, provider  I discussed the limitations of evaluation and management by telemedicine and the availability of in person appointments. The patient expressed understanding and agreed to proceed.   HPI:  Michelle Proctor called with onset of respiratory symptoms around Saturday night last weekend.  She developed nasal congestion, cough, sore throat.  No fever.  No body aches.  Mild fatigue.  Cough has been her most bothersome symptom and interfering with sleep.  No relief with over-the-counter medications.  She did home COVID test which came back negative.  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  She is generally healthy though she did have non-Hodgkin's lymphoma back in 2018.  No recent appetite or weight changes.  No dyspnea.  No pleuritic pain.   ROS: See pertinent positives and negatives per HPI.  Past Medical History:  Diagnosis Date  . Anemia associated with chemotherapy 10/29/2016  . Asthma   . Chicken pox   . DLBCL (diffuse large B cell lymphoma) ([place]) 10/29/2016  . History of blood transfusion   . Pneumothorax on left 10/05/2016    No past surgical history on file.  Family History  Problem Relation Age of Onset  . Arthritis Mother   . Thyroid disease Mother   . Thyroid disease Father   . Heart disease Father        heart  valve replacement and bypasses  . Arthritis Maternal Grandmother   . Heart attack Maternal Grandfather   . Hyperlipidemia Maternal Grandfather   . Heart attack Paternal Grandfather     SOCIAL HX: Non-smoker   Current Outpatient Medications:  .  benzonatate (TESSALON PERLES) 100 MG capsule, Take 1 capsule (100 mg total) by mouth 3 (three) times daily as needed for cough., Disp: 30 capsule, Rfl: 0 .  cetirizine (ZYRTEC) 10 MG tablet, Take 10 mg by mouth daily., Disp: , Rfl:  .  HYDROcodone bit-homatropine (HYCODAN) 5-1.5 MG/5ML syrup, Take 5 mLs by mouth every 8 (eight) hours as needed for cough., Disp: 120 mL, Rfl: 0 .  albuterol (VENTOLIN HFA) 108 (90 Base) MCG/ACT inhaler, Inhale 2 puffs into the lungs every 6 (six) hours as needed for wheezing or shortness of breath. (Patient not taking: Reported on 11/22/2020), Disp: 1 each, Rfl: 2 .  ascorbic acid (VITAMIN C) 500 MG tablet, Take by mouth. (Patient not taking: Reported on 11/22/2020), Disp: , Rfl:  .  Cholecalciferol 25 MCG (1000 UT) tablet, Take by mouth. (Patient not taking: Reported on 11/22/2020), Disp: , Rfl:   EXAM:  VITALS per patient if applicable:  GENERAL: alert, oriented, appears well and in no acute distress  HEENT: atraumatic, conjunttiva clear, no obvious abnormalities on inspection of external nose and ears  NECK: normal movements of the head and neck  LUNGS: on inspection no signs of respiratory distress, breathing rate appears normal, no obvious gross SOB, gasping or wheezing  CV:  no obvious cyanosis  MS: moves all visible extremities without noticeable abnormality  PSYCH/NEURO: pleasant and cooperative, no obvious depression or anxiety, speech and thought processing grossly intact  ASSESSMENT AND PLAN:  Discussed the following assessment and plan:  Probable viral URI with cough.  Patient is afebrile.  No respiratory distress.  -Recommend symptomatic treatment with plenty fluids and rest -Tessalon Perles  100 mg every 8 hours as needed for cough -Wrote for limited Hycodan cough syrup to take 1 teaspoon nightly if not relieved with Tessalon -Follow-up promptly for any fever or any persistent or worsening symptoms     I discussed the assessment and treatment plan with the patient. The patient was provided an opportunity to ask questions and all were answered. The patient agreed with the plan and demonstrated an understanding of the instructions.   The patient was advised to call back or seek an in-person evaluation if the symptoms worsen or if the condition fails to improve as anticipated.     Carolann Littler, MD

## 2020-11-28 DIAGNOSIS — M9901 Segmental and somatic dysfunction of cervical region: Secondary | ICD-10-CM | POA: Diagnosis not present

## 2020-12-05 DIAGNOSIS — F4320 Adjustment disorder, unspecified: Secondary | ICD-10-CM | POA: Diagnosis not present

## 2020-12-18 DIAGNOSIS — M9901 Segmental and somatic dysfunction of cervical region: Secondary | ICD-10-CM | POA: Diagnosis not present

## 2021-01-02 ENCOUNTER — Ambulatory Visit: Payer: BC Managed Care – PPO | Admitting: Physician Assistant

## 2021-01-04 DIAGNOSIS — F4320 Adjustment disorder, unspecified: Secondary | ICD-10-CM | POA: Diagnosis not present

## 2021-01-17 DIAGNOSIS — M9901 Segmental and somatic dysfunction of cervical region: Secondary | ICD-10-CM | POA: Diagnosis not present

## 2021-02-07 DIAGNOSIS — M9901 Segmental and somatic dysfunction of cervical region: Secondary | ICD-10-CM | POA: Diagnosis not present

## 2021-02-07 DIAGNOSIS — F4320 Adjustment disorder, unspecified: Secondary | ICD-10-CM | POA: Diagnosis not present

## 2021-02-13 ENCOUNTER — Ambulatory Visit: Payer: BC Managed Care – PPO | Admitting: Physician Assistant

## 2022-04-08 ENCOUNTER — Encounter: Payer: Self-pay | Admitting: *Deleted

## 2022-06-27 ENCOUNTER — Encounter: Payer: Self-pay | Admitting: *Deleted
